# Patient Record
Sex: Female | Born: 1965 | Race: Black or African American | Hispanic: No | Marital: Married
Health system: Midwestern US, Community
[De-identification: ages and names within clinical notes are randomized; demographics above are authoritative.]

---

## 1999-08-02 NOTE — ED Provider Notes (Signed)
[facility]                      EMERGENCY DEPARTMENT TREATMENT REPORT   NAMEMarland Kitchen  KIAHNA, BANGHART   MR#:  [birth year]     BILLING #: [id number]         DOS: 08/02/1999  TIME: 4:03                                                                    A   cc:   Docia Furl, M.D.   Primary Physician:   CHIEF COMPLAINT:   Epigastric and chest pain.   HISTORY OF PRESENT ILLNESS:  The patient states that for the last three to   four days she has been having intermittent left pain.  It is mostly in her   side.  She points under her ribs in the epigastric area.  Sometimes it   shoots up to her shoulders.  Sometimes it shoots down to her hip.  It   occasionally makes her nauseous.  She has also had some numbness in her   arms and just a variety of vague symptoms.  She is not sure exactly what is   going on.  She states that currently she sort of feels nauseous and that   most of her problem is in the epigastric area.   REVIEW OF SYSTEMS:   CONSTITUTIONAL:  No fever, chills, weight loss.   EYES: No visual symptoms.   ENT:  She has had a bit of a runny nose.   ENDOCRINE:  No diabetic symptoms.   HEMATOLOGIC/LYMPHATIC:  No excessive bruising or lymph node swelling.   ALLERGIC/IMMUNOLOGIC:  No urticaria or allergy symptoms.   RESPIRATORY:  She has had no shortness of breath.   She does state sort of   a chest pain type, but she points mostly to her lower ribs on the left.   GASTROINTESTINAL:  She complains of epigastric pain and occasional nausea.   GENITOURINARY:  No dysuria, frequency, or urgency.   MUSCULOSKELETAL:  No joint pain or swelling.   INTEGUMENTARY:  No rashes.   PAST MEDICAL HISTORY:  Negative except for depression and asthma.   PAST SURGICAL HISTORY:   Negative.   SOCIAL HISTORY:   Negative.  Quit smoking two years ago.   PHYSICAL EXAMINATION:   VITAL SIGNS:  Stable.   GENERAL APPEARANCE:  Patient appears well-developed and well-nourished.    Appearance and behavior are age and situation appropriate.   She appears   comfortable.   HEENT:   Eyes:  Conjunctiva clear, lids normal.  Pupils equal, symmetrical,   and normally reactive.      Ears/Nose:  Hearing is grossly intact to voice.   Internal and external examinations of the ears are unremarkable.   LYMPHATICS:  No cervical or submandibular lymphadenopathy palpated.   RESPIRATORY:  Clear and equal BS.  No respiratory distress, tachypnea, or   accessory muscle use.   CARDIOVASCULAR:  Heart regular, without murmurs, gallops, rubs, or thrills.   PMI not displaced.   She has no chest wall tenderness.   ABDOMEN:  She has very minimal tenderness in the epigastric area, but does   state that is where she has  been feeling the pain.  Otherwise, normoactive   bowel sounds, no peritoneal signs.   EXTREMITIES:  No clubbing, cyanosis, or edema.   PSYCHIATRIC:  Judgment appears appropriate.   Recent and remote memory   appear to be intact.  Oriented to time, place and person.  Mood and affect   appropriate.   SKIN:  Warm and dry without rashes.   IMPRESSION/MANAGEMENT PLAN:   The patient with sort of a nonlocalized area   of pain in the left side of her abdomen and lower chest.  Do not believe   this is cardiac in etiology.  The patient does appear comfortable at this   point.  Differential diagnosis includes gastritis, ulcer disease,   gallbladder dysfunction, pleurisy, pneumonia, or the very low end pulmonary   embolism, although the patient is not tachypneic, tachycardiac,  and is   saturating 100 percent on room air.   DIAGNOSTIC TESTING:   An EKG and a chest x-ray were done.  They were both   within normal limits.  The patient was given a GI cocktail with a great   deal of relief.   FINAL DIAGNOSIS:  Epigastric pain.   DISPOSITION:  The patient will be taking Mylanta before each meal and will   be on a bland diet.  The patient is discharged home in stable condition,    with instructions to follow up with their regular doctor.  They are advised   to return immediately for any worsening or symptoms of concern.   Electronically Signed By:   Docia Furl, M.D. 08/20/1999 02:02   ____________________________   Docia Furl, M.D.   jb D:  08/02/1999 T:  08/07/1999  7:02 P   [id number]

## 1999-10-30 NOTE — ED Provider Notes (Signed)
[facility]                      EMERGENCY DEPARTMENT TREATMENT REPORT   NAME:  Jessica Hamilton, Jessica Hamilton   MR #:  [birth year]   BILLING #: [id number]        DOS: 10/30/1999  TIME:12:52 P   cc:   Primary Physician:   CHIEF COMPLAINT:  Cough and wheezing.   HISTORY OF PRESENT ILLNESS:   The patient is a 33 -year-old female with dry   cough over the last three days' time with increased wheezing today. 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  She denies any associated fever.   REVIEW OF SYSTEMS:   RESPIRATORY: Positive as noted.   CONSTITUTIONAL:  No fever, chills, weight loss.   Denies complaints in any other system.   PAST MEDICAL HISTORY:   Remarkable for asthma.   SOCIAL HISTORY:    The patient is a nonsmoker.  She quit smoking 2 years   ago.   PHYSICAL EXAMINATION:   GENERAL:   The patient is alert and cooperative.   VITAL SIGNS: Blood pressure 106/35, pulse rate of 81, respiratory rate of   28.   HEENT:  Eyes:  Conjunctiva clear, lids normal.  Pupils equal, symmetrical,   and normally reactive.    Mouth/Throat:  Surfaces of the pharynx, palate,   and tongue are pink, moist, and without lesions.   NECK:  Supple, nontender, symmetrical, no masses or JVD, trachea midline,   thyroid not enlarged, nodular, or tender.   PULMONARY: Shows bilateral inspiratory and expiratory wheezes. No use of   accessory muscles noted.   CARDIOVASCULAR:  Heart regular, without murmurs, gallops, rubs, or thrills.   PMI not displaced.   GI:  Abdomen soft, nontender, without complaint of pain to palpation.  No   hepatomegaly or splenomegaly.   MUSCULOSKELETAL:   Nails:  No clubbing or deformities.  Nailbeds pink with   prompt capillary refill.   SKIN:  Warm and dry without rashes.   NEUROLOGIC:  Cranial nerves, deep tendon reflexes, strength, and light   touch sensation are unremarkable.    COURSE IN THE EMERGENCY DEPARTMENT: The patient was given a Proventil and   Atrovent neb followed by Proventil nebs q.10 minutes times two.  She was   given saline lock. She was given Solu-Medrol 125 mg IV.  Tussionex 5 mL   orally was given.   On reevaluation, the patient was resting more comfortably. She had forced   expiratory wheezes, but air exchange was improved.  She was discharged home   with a prescription for Phenergan with codeine, Proventil metered-dose   inhaler and prednisone to be taken as directed.  She is to be directed for   worsening of symptoms.   FINAL DIAGNOSIS:   1.  Acute asthma exacerbation.   DISPOSITION:   The patient was discharged home in stable and improved   condition.   Electronically Signed By:   Erlinda Hong, M.D. 11/01/1999 18:32   ____________________________   TODD Wilfrid Lund, M.D.   zga D:  10/30/1999 T:  10/30/1999  2:16 P   [id number]

## 2001-03-24 NOTE — ED Provider Notes (Signed)
[facility]                      EMERGENCY DEPARTMENT TREATMENT REPORT   NAME:  SUSI, GOSLIN   MR #:  [birth year]   BILLING #: [id number]        DOS: 03/24/2001  TIME: 5:14 P   cc:   Primary Physician:   CHIEF COMPLAINT:  Laceration.   HISTORY OF PRESENT ILLNESS:  [age]-year-old female presents with   laceration left middle finger, sustained today when hand struck a bolt.  No   other complaints.   REVIEW OF SYSTEMS:   MUSCULOSKELETAL:  Positive laceration left middle finger.   PAST MEDICAL HISTORY:  States history of prior overdose.   MEDICATIONS:  None.   ALLERGIES:  None.   IMMUNIZATIONS:  Last tetanus greater than 10 years ago.   SOCIAL HISTORY:  Injury occurred at work.   PHYSICAL EXAMINATION:   VITAL SIGNS:  Blood pressure 126/74; pulse 87; respirations 20; temperature   98.5.   GENERAL:  Well-developed, well-nourished female, alert.   EXTREMITIES:  Left middle finger:  1.5 cm partial flap-like laceration   noted dorsal aspect of the left middle finger over middle phalanx with some   active bleeding noted.  Light touch sensation intact.  Normal capillary   refill.   PROCEDURE NOTE:  The wound cleansed with Betadine and saline and   anesthetized with 1%Xylocaine and explored.  No tendon involvement.  The   wound was sutured with [id number] simple interrupted sutures.   Bacitracin and dressing placed.   FINAL DIAGNOSIS:   1.  Laceration, left middle finger, 1.5 centimeters, simple closure.   DISPOSITION:  Wound care instructions given.  Suture removal in 10-12 days.   The patient was evaluated by myself and Dr. Wilfrid Lund, who agrees with   the above assessment and plan.   Electronically Signed By:   Erlinda Hong, M.D. 03/24/2001 18:39   ____________________________   TODD Wilfrid Lund, M.D.   cd D:  03/24/2001 T:  03/24/2001  5:19 P   [id number]   DIANA A HOULE, PA-C

## 2002-01-13 NOTE — ED Provider Notes (Signed)
[facility]                      EMERGENCY DEPARTMENT TREATMENT REPORT   NAME:  DALMA, PANCHAL   MR #:         BILLING #: [id number]          DOS: 01/13/2002   TIME: 9:21 P   [birth year]   cc:   Primary Physician:   Time of evaluation:  2030.   CHIEF COMPLAINT:  Back pain.   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  She states that her work told she could not   leave and had to continue doing deliveries as well.  After she got off work   approximately at 6:30 she came immediately to the emergency department   complaining of right scapular pain and upper back pain.  She denies any   loss of consciousness, any other injuries.   REVIEW OF SYSTEMS:   HEMATOLOGIC/LYMPHATIC:  No excessive bruising or lymph node swelling.   RESPIRATORY:  No cough, shortness of breath, or wheezing.   CARDIOVASCULAR:  No chest pain, chest pressure, or palpitations.   MUSCULOSKELETAL:  No joint pain or swelling.   NEUROLOGICAL:  No headaches, sensory or motor symptoms.   PAST MEDICAL HISTORY:  Asthma.   FAMILY HISTORY:  Negative.   SOCIAL HISTORY:  Negative.   ALLERGIES:  No known drug allergies.   MEDICATIONS:  None.   PHYSICAL EXAMINATION:   VITAL SIGNS:  Blood pressure 113/69; pulse 67; respirations 18; temperature   98.7.  On a pain scale of 0-10, the patient was a 10/10.   GENERAL APPEARANCE:  The patient appears well developed and well nourished.   Appearance and behavior are age and situation appropriate.   The patient   appears significantly uncomfortable secondary to her upper back and right   scapula.   HEENT:  Head is normocephalic and atraumatic.  Eyes:  Conjunctivae clear,    lids normal.  Pupils equal, symmetrical, and normally reactive.   NECK:  Supple, nontender, symmetrical, no masses or JVD, trachea midline,   thyroid not enlarged, nodular, or tender.   RESPIRATORY:  Clear and equal BS.  No respiratory distress, tachypnea, or   accessory muscle use.  Chest percussion normal.   CARDIOVASCULAR:  Heart regular, without murmurs, gallops, rubs, or thrills.   PMI not displaced. Carotid pulses 2+ and equal bilaterally without bruits.   GI:  Abdomen soft, nontender, without complaint of pain to palpation.  No   hepatomegaly or splenomegaly.   MUSCULOSKELETAL:  Stance and gait appear normal.  Nails:  No clubbing or   deformities.  Nail beds pink with prompt capillary refill.  The patient has   nontender chest, chest wall, and abdomen.  Upper and lower extremities have   good full range of motion without any pain.  The patient has negative point   tenderness to her cervical spine but does have moderate point tenderness   and diffuse tenderness to the thoracic area and right scapula.  Negative   lumbar and sacral bony tenderness, deformities, ecchymosis, soft tissue   swelling, or bony step-offs.   SKIN:  Warm and  dry without rashes.   NEUROLOGIC:  Cranial nerves, deep tendon reflexes, strength, and light   touch sensation are unremarkable.   The patient will have Demerol 20 mg and Vistaril 25 mg IM and will have a   thoracic spine and right scapula film x-ray.   CONTINUATION BY NELSON SANTIAGO, PA-C:   DIAGNOSTIC STUDIES:   T spine and right scapular x-ray were negative.  The   patient's pain at the present time is 1/10.   FINAL DIAGNOSIS:  Upper back contusion secondary to fall.   DISPOSITION:  The patient is discharged with verbal and written   instructions and a referral for ongoing care.  The patient is aware that   they may return at any time for new or worsening symptoms.  Condition is   stable.  Disposition is home.   The patient was told to rest, apply ice for    the next 24 to 48 hours 4-5 times a day 15-20 minute intervals.  Then on   the 3rd to 5th day warm compresses at the same intervals.  Take medications   as directed.  Limited duty until 01/20/02.  No heavy lifting.  If not   better in 7-10 days, the patient can see Dr. Gaynelle Adu or Dr. Silvio Clayman.  The   patient was given Vicodin, #14, 1 q.4-6h. p.r.n., Skelaxin 400 mg to take 1   p.o. q.i.d., #30 given with no refill.  The patient was given off work   until January 21 and then limited duty until January 24.   Electronically Signed By:   Imogene Burn, M.D. 01/17/2002 11:15   ____________________________   Imogene Burn, M.D.   cd/rew  D:  01/13/2002  T:  01/13/2002 10:47 P   [id number]   Hilaria Ota, PA-C

## 2002-07-05 NOTE — ED Provider Notes (Signed)
[facility]                      EMERGENCY DEPARTMENT TREATMENT REPORT   NAME:  Jessica Hamilton, Jessica Hamilton   MR #:         BILLING #: [id number]          DOS: 07/05/2002   TIME:12:45 A   [birth year]   cc:    Patricia Pesa, M.D.   Primary Physician:   CHIEF COMPLAINT:  Headache and dizziness.   HISTORY OF PRESENT ILLNESS:   Yesterday this 36 year old female began   having dizziness in that it feels as though the room is moving around her   from time to time. Moving suddenly does aggravate it but there are no   alleviating factors. It can also happen while she is lying still.  She has   had daily posterior headaches which are 8/10 ever since January, when she   fell on the ice. At that time a CAT scan was negative for anything other   than sinusitis. She has had no recent increase in her headaches and no new   trauma.  She is also concerned because she feels her speech is slurred from   time to time. She has never had the symptom before. Denies any chest pain.   No trouble breathing or swallowing. No abdominal pain, nausea or vomiting.   Denies any numbness, tingling, weakness, or loss of motion of any part of   her body.   REVIEW OF SYSTEMS:   CONSTITUTIONAL:  No fever or chills.   EYES: No blurred or double vision.   ENT: No URI symptoms.   ENDOCRINE:  No diabetic symptoms.   HEMATOLOGIC: No known bleeding dyscrasias.   ALLERGIC/IMMUNOLOGIC:  No urticaria or allergy symptoms.   RESPIRATORY:  No cough, shortness of breath, or wheezing.   CARDIOVASCULAR:  No chest pain, chest pressure, or palpitations.   GASTROINTESTINAL:  No vomiting, diarrhea, or abdominal pain.   MUSCULOSKELETAL:  No joint pain or swelling.   INTEGUMENTARY:  No rashes.   NEUROLOGIC: As in HPI.   PAST MEDICAL HISTORY:  Depression and chronic back pain.   ALLERGIES: Allergic to Tylenol (GI upset).   MEDICATIONS:   No current medications.   FAMILY HISTORY: No family history of early cardiac disease. No family    history of cerebrovascular accident.   SOCIAL HISTORY: Quit smoking 5 years ago.  Nondrinker.   PHYSICAL EXAMINATION:   CONSTITUTIONAL:  Alert tearful 36 year old female.   VITAL SIGNS: Supine blood pressure is 107/65, pulse 70, erect blood   pressure is 128/84, pulse 66, respirations 14, temperature 98.7.   HEENT:  Pupils equal, round, reactive to light. Extraocular movements are   intact. Fundi:  Optic discs are normal in appearance, no gross hemorrhages   or exudates seen.   ENT: The tympanic membranes are canals are clear   without hemotympanum.   NECK:  Soft, supple and nontender with full range of motion.   LYMPHATICS: No cervical or submandibular lymphadenopathy palpated.   RESPIRATORY:  Clear and equal breath sounds.  No respiratory distress,   tachypnea, or accessory muscle use.   CARDIOVASCULAR:  Heart regular, without murmurs, gallops, rubs, or thrills.   PMI not displaced.   GI:  Abdomen soft, supple, nontender, nondistended, with no organomegaly.   MUSCULOSKELETAL: Bilateral upper and lower extremities have full range of   motion. Stance and gait appear normal.   NEUROLOGIC:  Cranial nerves  II-XII grossly intact. Deep tendon reflexes and   strength are unremarkable.   PSYCHIATRIC: Oriented to person, place and time, tearful.   CONTINUATION BY DR. Henrene Hawking:   IMPRESSION/MANAGEMENT PLAN:  Headache since fall in January, persistent.   The patient is complaining of dizziness since yesterday which he states is   vertiginous.   COURSE IN THE EMERGENCY DEPARTMENT:  On reevaluation, the patient without   any nystagmus on examination.  The patient complaining of continued   dizziness.  Neuro examination is completely normal.   DIAGNOSTIC TESTING:  An i-STAT revealed normal electrolytes, glucose 93,   hemoglobin 12, hematocrit 35.  CT scan of the head negative per radiology.   DISPOSITION:  Antivert as needed for dizziness.  Follow up with Dr. Girtha Hake,    on call for medicine.  Return if any worsening symptoms or further   problems.   CONDITION ON DISCHARGE:  Stable.   FINAL DIAGNOSIS:   Persistent cephalgia with dizziness/vertigo.   Electronically Signed By:   Haze Justin, M.D. 07/10/2002 21:24   ____________________________   Haze Justin, M.D.   dd/zga  D:  07/06/2002  T:  07/06/2002  5:15 A   [id number]   Dineen Kid, PA-C

## 2003-10-20 NOTE — ED Provider Notes (Signed)
[facility]                      EMERGENCY DEPARTMENT TREATMENT REPORT   NAME:  Jessica Hamilton, Jessica Hamilton                        PT. LOCATION:   MR #:         BILLING #: [id number]          DOS: 10/20/2003   TIME: 7:15 A   01-12-06   cc:    Billey Chang. MARKS, M.D.   Primary Physician:   Time was: 41.   CHIEF COMPLAINT:  "Something going on in my head where I can't talk and   then I can."   PREHOSPITAL CARE/EMS: Was transported via EMS with IV access obtained and   had a blood sugar of 170.  Initial vital signs: Pulse 80, respirations 18,   blood pressure 131/84, O2 saturation 100% on room air.   HISTORY OF PRESENT ILLNESS: This is a 37 year old black female who has had   a cold recently, went to bed about 2100 tonight and sometime after this   woke up because she has having these "spells."  She feels as if she cannot   speak and then she can speak.  They do not seem to last more then a few   seconds and seemed to staring and then will let out a big breath at the end   of each of these episodes. She has been taking over the counter cold   medicines and took some ?Tylenol earlier tonight.  She denies any history   of similar symptoms and there has been no stress at her house, such as   arguing with anybody.  Family members at the bedside confirm this as well.   She has had some numbness in her left arm tonight, but no other tingling or   weakness. She denies any pain at this time.   REVIEW OF SYSTEMS:   CONSTITUTIONAL:  No fever, chills, weight loss.   ENT:   Has had some nasal/sinus congestion recently.   HEMATOLOGIC/LYMPHATIC:  No excessive bruising or lymph node swelling.   RESPIRATORY:  No cough, shortness of breath, or wheezing.   CARDIOVASCULAR:  No chest pain, chest pressure, or palpitations.   GASTROINTESTINAL:  No vomiting, diarrhea, or abdominal pain.   GENITOURINARY:  No dysuria, frequency, or urgency.   PSYCHIATRIC: Staring spells tonight, short lived in nature and does not    speak during these episodes, and then seems fine in between the episodes.   She is somewhat tearful and denies any stress at home.   PAST MEDICAL HISTORY:  Asthma, prior depression and overdose of Tylenol -   last was admitted here 12/98 for this.   SOCIAL HISTORY: Quit smoking years ago and does not drink alcohol.   ALLERGIES:  Tylenol - GI upset since overdose.   MEDICATIONS:   Over the counter cough drops and Robitussin syrup, Tylenol.   PHYSICAL EXAMINATION:   VITAL SIGNS: Blood pressure 126/72, pulse 76, respirations 18, temperature   98.9, O2 saturation 100% on room air.   GENERAL APPEARANCE:  The patient appears well developed and well nourished.   Appearance and behavior are age and situation appropriate.   However, she   is somewhat intermittently tearful also.   HEENT:   Eyes:  Conjunctivae clear, lids normal.  Pupils equal,   symmetrical, and normally reactive.  NECK:  Supple, nontender, symmetrical, no masses or JVD, trachea midline,   thyroid not enlarged, nodular, or tender.   RESPIRATORY:  Clear and equal breath sounds.  No respiratory distress,   tachypnea, or accessory muscle use.   CARDIOVASCULAR:  Heart regular, without murmurs, gallops, rubs, or thrills.   PMI not displaced.   GASTROINTESTINAL:  Abdomen soft, nontender, without complaint of pain to   palpation.  No hepatomegaly or splenomegaly.   NEUROLOGICAL:  The patient is awake, alert, and aware of surroundings. The   patient shows no focal motor deficits.   PSYCHIATRIC: Is tearful, having intermittent staring spells, seems to be   nonverbal and then after a few seconds will start speaking again when   prompted to.   CONTINUATION BY DR. KISA:   INITIAL ASSESSMENT AND MANAGEMENT PLAN:  The patient presents here with   dizziness, blacking out for the few seconds, holding her breath when it   happens and feeling uncomfortable.  She denies any pain.  No tonoclonic   seizure seen by family, no incontinence.  She does have a runny nose with    right ear pain.  She speaks for awhile and then she stops talking and then   she comes to again and starts talking again.  Family says this is   intermittently occurring.  EMS brought her here and blood sugar was 170.   She will be evaluated further for this.  Nursing notes were reviewed.  Old   records reviewed.   LABORATORY TESTS:  EKG showed normal sinus rhythm, no axis deviation, no   acute abnormalities.   COURSE IN THE EMERGENCY DEPARTMENT:  The patient had an I-stat which was   essentially within normal limits.  Her symptoms did not abate and   continued.  She therefore had a CT ordered for the central cause of this   dizziness and vertigo symptoms that she described with everything moving   around her.  The patient was given Antivert, Phenergan and Benadryl to see   if her symptoms were labyrinthitis related and if they would improve.  Dr.   Henrene Hawking assumed care of this patient at 0715 hours and will follow up   results of this testing and arrange proper disposition.   CONTINUATION BY DR. Melvyn Neth SIEGEL:   COURSE IN THE EMERGENCY DEPARTMENT:  Assumed care of patient from Dr. Arvella Merles   pending head CT.  Head CT normal per radiology.  On reevaluation the   patient still complains of episodes of dizziness where she states she feels   like she is in a trance for periods of time lasting up to 15 seconds.   Neurological examination is normal at the time of reevaluation.  She is   just complaining of some associated dizziness.  I discussed with the   patient the need to follow up with a medical doctor for further evaluation   including a possible EEG if the episodes continue.  I also instructed her   that she cannot drive until she is cleared by a physician secondary to   these episodes being of a recurrent nature.  This has been documented in   the discharge instructions as well. She is given a  prescription for   Antivert as needed and instructed to return if worsening symptoms or   further problems.    CONDITION ON DISCHARGE;  Stable.   FINAL DIAGNOSIS:  Episodic dizziness with altered level of consciousness.   Electronically Signed By:  ERIK H. Arvella Merles, M.D. 10/24/2003 17:02   ____________________________   Wetzel Bjornstad. Arvella Merles, M.D.   sp/sp/jdm  D:  10/20/2003  T:  10/20/2003  9:27 A   [id number](pt1)   Marval Regal, PA-C:

## 2003-11-06 NOTE — Procedures (Signed)
[facility]                           ELECTROENCEPHALOGRAM REPORT   NAME:     Jessica Hamilton, Jessica Hamilton                         DATE:     11/06/2003   EEG#:      [id number]                                      MR#:      [id number]   ROOM:     OP                                         DOB:      02/10/1966   REFERRING PHYSICIAN:    C. Marks   CLINICAL HISTORY:  Episodes of altered level of consciousness; periodic   dyskinesias.   MEDICATION:  Meclizine, Vitamins   DESCRIPTION:  Electrodes were applied according to the standard 10/20   electrode system.  A 16-channel EEG with one channel EKG monitoring was   obtained in the laboratory using the [employer]well digital EEG system.  The   background activity is a well developed low to medium amplitude (5-50   microvolt), 11-13 Hz activity, which is occipitally dominant, symmetric in   the two hemispheres and attenuates appropriately with eye opening.   Occasional runs of irregular theta frequency activity is seen in the left   temporal region.  Three minutes of hyperventilation on performed which show   a good degree of background amplification but fails to activate any   paroxysmal activity, however, some small activity persists up to 2 minutes   after cessation of hyperventilation.   Photic stimulation is performed which shows a good degree of photic driving   symmetrically in the occipital regions.  The left temporal slowing is a   little bit more prominent during photic stimulation.   CLINICAL INTERPRETATION:  Abnormal EEG on the basis of mild slow activity   in the left temporal region.  This indicates a mild neuronal disturbance in   this area.  No distinctly paroxysmal features are seen.   CLASSIFICATION:  Dysrhythmia Grade I; left temporal slowing.   ____________________________________    ___________________________   GILBERT M. SNIDER, M.D.                     Date   bes  D: 11/06/2003  T: 11/08/2003  1:24 P   [id number]

## 2003-11-06 NOTE — Procedures (Signed)
[facility]                           ELECTROENCEPHALOGRAM REPORT   NAME:     Jessica Hamilton, Jessica Hamilton                         DATE:     11/06/2003   EEG#:      [id number]                                      MR#:      [id number]   ROOM:     OP                                         DOB:      04/28/1966   REFERRING PHYSICIAN:    Konrad Saha   CLINICAL HISTORY:  Episodes of altered level of consciousness; periodic   dyskinesias.   MEDICATION:  Meclizine, Vitamins   DESCRIPTION:  Electrodes were applied according to the standard 10/20   electrode system.  A 16-channel EEG with one channel EKG monitoring was   obtained in the laboratory using the Cadwell digital EEG system.  The   background activity is a well developed low to medium amplitude (5-50   microvolt), 11-13 Hz activity, which is occipitally dominant, symmetric in   the two hemispheres and attenuates appropriately with eye opening.   Occasional runs of irregular theta frequency activity is seen in the left   temporal region.  Three minutes of hyperventilation on performed which show   a good degree of background amplification but fails to activate any   paroxysmal activity, however, some small activity persists up to 2 minutes   after cessation of hyperventilation.   Photic stimulation is performed which shows a good degree of photic driving   symmetrically in the occipital regions.  The left temporal slowing is a   little bit more prominent during photic stimulation.   CLINICAL INTERPRETATION:  Abnormal EEG on the basis of mild slow activity   in the left temporal region.  This indicates a mild neuronal disturbance in   this area.  No distinctly paroxysmal features are seen.   CLASSIFICATION:  Dysrhythmia Grade I; left temporal slowing.   ____________________________________    ___________________________   Rod Holler, M.D.                     Date   bes  D: 11/06/2003  T: 11/08/2003  1:24 P   [id number]

## 2009-09-27 NOTE — ED Provider Notes (Signed)
[facility]                      EMERGENCY DEPARTMENT TREATMENT REPORT   PRELIMINARY (DRAFT) -- FINAL REPORT  in HPF   NAME:  Jessica Hamilton, Jessica Hamilton               SEX:            F   DATE:  09/27/2009                     DOB:            1966/12/07   MR#    01-12-06                       TIME SEEN        8:50 A   ACCT#  [id number]                      ROOM:           ER  ER71   cc:   CHIEF COMPLAINT   Asthma.   HISTORY OF PRESENT ILLNESS   This is a 43 year old female who is here with her daughter who is up in   labor and delivery.  She started to feel like she was having an asthma   attack.  The patient was in the process of going out to get her inhaler   when apparently she was stopped by a staff member and brought into the ER.   She is receiving a nebulizer treatment currently and she is anxious to get   upstairs.  She states she has a history of asthma for which she uses   albuterol and Advair as needed.  She denies any chest pain.  She has not   had any leg pain or swelling.  She denies any abdominal pain.   REVIEW OF SYSTEMS   CONSTITUTIONAL:  No fever, chills, weight loss.   ENT: No sore throat, runny nose or other URI symptoms.   RESPIRATORY: Positive for shortness of breath and wheezing.   CARDIOVASCULAR:  No chest pain, chest pressure, or palpitations.   GASTROINTESTINAL:  No vomiting, diarrhea, or abdominal pain.   MUSCULOSKELETAL:  Denies any leg pain or swelling.   INTEGUMENTARY:  No rashes.   PAST MEDICAL HISTORY   Asthma.  Last menstrual period was around 09/07/2009.   SOCIAL HISTORY   Negative for tobacco.  She was in the process of going upstairs to see her   daughter who is in labor and delivery.   FAMILY HISTORY   Noncontributory.   ALLERGIES   No known drug allergies.   MEDICATIONS   Advair and albuterol.   PHYSICAL EXAMINATION   GENERAL:  This is a well-developed female.   VITAL SIGNS:  Blood pressure 118/79, pulse 103, respirations 24,    temperature 98.1, and O2 saturation 94% on room air.   HEENT:  Eyes:  Conjunctivae are clear.  Ears/Nose:  TMs equal bilaterally.   Mouth/Throat:  Mucous membranes are pink.  Throat is clear.   NECK:  Supple, nontender, symmetrical, no masses or JVD, trachea midline.   Thyroid not enlarged, nodular, or tender.   RESPIRATORY:  Lungs: Just a bit diminished at this time.  I cannot hear any   frank wheezing.  She is not dyspneic.  She is not tachypneic.   CARDIOVASCULAR:  Heart has regular rate  and rhythm.   GI:  Abdomen is soft and nontender.   EXTREMITIES:  Calves and nontender.   SKIN:  Without rash.   INITIAL ASSESSMENT/MANAGEMENT PLAN   This is a 43 year old female brought in for evaluation of an asthma attack.   At this time, she is on her second nebulizer.  She is feeling much better,   is anxious to go.  She will be given 60 mg of prednisone here.  Dr. Jerolyn Center   was in to see the patient, findings were discussed.  The patient will be   discharged here with an inhaler, prednisone to continue, push fluids, and   certainly return at anytime if worsening or new concerns.  Repeat   saturations were 99% on room air.  She was moving air well and was not in   any respiratory distress.   FINAL DIAGNOSIS   Evaluation of acute asthma exacerbation.   DISPOSITION/PLAN   The patient was discharged home in stable condition, follow up as above.   The patient was examined by myself and Dr. Stephan Minister agrees with my   assessment and plan.   ____________________________   Stephan Minister, M.D.   Dictated By:  Fara Chute, PA-C   My signature above authenticates this document and my orders, the final   diagnosis(es), discharge prescription(s) and instructions in the [facility] record.   tw  D:  09/27/2009  T:  09/28/2009  7:41 A   [id number]

## 2016-08-08 ENCOUNTER — Emergency Department: Admit: 2016-08-08 | Payer: Self-pay | Primary: Family Medicine

## 2016-08-08 ENCOUNTER — Inpatient Hospital Stay
Admit: 2016-08-08 | Discharge: 2016-08-08 | Disposition: A | Discharge status: Home / Self Care | Payer: Self-pay | Attending: Emergency Medicine

## 2016-08-08 DIAGNOSIS — R079 Chest pain, unspecified: Secondary | ICD-10-CM

## 2016-08-08 LAB — EKG 12-LEAD
Atrial Rate: 69 {beats}/min
Diagnosis: NORMAL
P Axis: 57 degrees
P-R Interval: 164 ms
Q-T Interval: 416 ms
QRS Duration: 64 ms
QTc Calculation (Bazett): 445 ms
R Axis: -3 degrees
T Axis: 23 degrees
Ventricular Rate: 69 {beats}/min

## 2016-08-08 LAB — ECHO STRESS
Duke treadmill score: -4
Duke treadmill score: -4
ECG Interp During Ex: NORMAL
ECG Interp. During Exercise: NORMAL
Functional Capacity: NORMAL
Functional capacity: NORMAL
Max Diastolic BP: 89 mmHg
Max Heart Rate: 150 {beats}/min
Max Systolic BP: 170 mmHg
Max. Diastolic BP: 89 mmHg
Max. Heart rate: 150 {beats}/min
Max. Systolic BP: 170 mmHg
Overall BP Response To Exercise: NORMAL
Overall BP response to exercise: NORMAL
Overall HR Response To Exercise: NORMAL
Overall HR response to exercise: NORMAL
Peak EX METS: 1 METS
Peak Ex METs: 1 METS

## 2016-08-08 LAB — CBC WITH AUTOMATED DIFF
BASOPHILS: 0.7 % (ref 0–3)
EOSINOPHILS: 4.9 % (ref 0–5)
HCT: 38.8 % (ref 37.0–50.0)
HGB: 13.2 gm/dl (ref 13.0–17.2)
IMMATURE GRANULOCYTES: 0.5 % (ref 0.0–3.0)
LYMPHOCYTES: 38.8 % (ref 28–48)
MCH: 29.5 pg (ref 25.4–34.6)
MCHC: 34 gm/dl (ref 30.0–36.0)
MCV: 86.8 fL (ref 80.0–98.0)
MONOCYTES: 10.8 % (ref 1–13)
MPV: 10.7 fL — ABNORMAL HIGH (ref 6.0–10.0)
NEUTROPHILS: 44.3 % (ref 34–64)
NRBC: 0 (ref 0–0)
PLATELET: 198 10*3/uL (ref 140–450)
RBC: 4.47 M/uL (ref 3.60–5.20)
RDW-SD: 41.4 (ref 36.4–46.3)
WBC: 4.3 10*3/uL (ref 4.0–11.0)

## 2016-08-08 LAB — POC CHEM8
BUN: 16 mg/dl (ref 7–25)
CALCIUM,IONIZED: 4.8 mg/dL (ref 4.40–5.40)
CO2, TOTAL: 27 mmol/L (ref 21–32)
Chloride: 102 mEq/L (ref 98–107)
Creatinine: 0.8 mg/dl (ref 0.6–1.3)
Glucose: 97 mg/dL (ref 74–106)
HCT: 42 % (ref 38–45)
HGB: 14.3 gm/dl (ref 13.0–17.2)
Potassium: 3.8 mEq/L (ref 3.5–4.9)
Sodium: 139 mEq/L (ref 136–145)

## 2016-08-08 LAB — EKG, 12 LEAD, INITIAL
Atrial Rate: 69 {beats}/min
Calculated P Axis: 57 degrees
Calculated R Axis: -3 degrees
Calculated T Axis: 23 degrees
Diagnosis: NORMAL
P-R Interval: 164 ms
Q-T Interval: 416 ms
QRS Duration: 64 ms
QTC Calculation (Bezet): 445 ms
Ventricular Rate: 69 {beats}/min

## 2016-08-08 LAB — TROPONIN I: Troponin-I: 0.041 ng/ml (ref 0.000–0.045)

## 2016-08-08 LAB — POC TROPONIN
Troponin-I: 0.03 ng/ml (ref 0.00–0.07)
Troponin-I: 0.04 ng/ml (ref 0.00–0.07)

## 2016-08-08 MED ORDER — SODIUM CHLORIDE 0.9 % IJ SYRG
INTRAMUSCULAR | Status: DC | PRN
Start: 2016-08-08 — End: 2016-08-08

## 2016-08-08 MED ORDER — ACETAMINOPHEN 325 MG TABLET
325 mg | Freq: Four times a day (QID) | ORAL | Status: DC | PRN
Start: 2016-08-08 — End: 2016-08-08
  Administered 2016-08-08: 16:00:00 via ORAL

## 2016-08-08 MED ORDER — SODIUM CHLORIDE 0.9 % IJ SYRG
Freq: Three times a day (TID) | INTRAMUSCULAR | Status: DC
Start: 2016-08-08 — End: 2016-08-08

## 2016-08-08 MED ORDER — ASPIRIN 81 MG CHEWABLE TAB
81 mg | ORAL | Status: AC
Start: 2016-08-08 — End: 2016-08-08
  Administered 2016-08-08: 08:00:00 via ORAL

## 2016-08-08 MED FILL — ACETAMINOPHEN 325 MG TABLET: 325 mg | ORAL | Qty: 3

## 2016-08-08 MED FILL — ASPIRIN 81 MG CHEWABLE TAB: 81 mg | ORAL | Qty: 4

## 2016-08-08 NOTE — ED Notes (Signed)
Patient returned from stress echo, placed on tele monitor, and provided with a a snack. Vital signs obtained. Denies chest pain, denies shortness of breath.

## 2016-08-08 NOTE — ED Notes (Addendum)
Change of shift report received Leigh  RN(name) on Jessica Hamilton, Jessica Hamilton .  Report consisted of patient???s Situation, Background, Assessment and Recommendations(SBAR). Information from the following report(s) SBAR and Kardex was reviewed with the off going  Nurse. Opportunity for questions and clarification was provided.

## 2016-08-08 NOTE — ED Notes (Signed)
Bedside and Verbal shift change report given to Clara Control and instrumentation engineer(oncoming nurse) by Marliss CzarLeigh (offgoing nurse). Report included the following information SBAR, Kardex, ED Summary, MAR, Recent Results and Cardiac Rhythm sinus brady.

## 2016-08-08 NOTE — ED Notes (Signed)
TRANSFER - IN REPORT:    Verbal report received from  Peyton(name) on Ilyanna R Ladona Ridgelaylor  being received from ED(unit) for routine progression of care      Report consisted of patient???s Situation, Background, Assessment and   Recommendations(SBAR).     Information from the following report(s) SBAR, Kardex, ED Summary, MAR, Recent Results and Cardiac Rhythm NSR was reviewed with the receiving nurse.    Opportunity for questions and clarification was provided.      Assessment completed upon patient???s arrival to unit and care assumed.

## 2016-08-08 NOTE — ED Provider Notes (Signed)
[facility]  Emer[facility] Treatment Report    Patient: Jessica ConverseDeanna R Hamilton Age: 50 y.o. Sex: female    Date of Birth: 02-04-1966 Admit Date: 08/08/2016 PCP: Sol BlazingPhys Other, MD   MRN: [id number]  CSN: [id number]  Attending: Posey Prontoobert E Stambaugh, M.D.    Room: 101/EO01 Time Dictated: 6:55 AM APP:  Dan HumphreysAshley B Kaziyah Parkison, PA-C     Chief Complaint   Chest pain  History of Present Illness   50 y.o. female complaining of left-sided chest pain described as a pressure pain with a radiating tingling sensation to her left arm at 0200. Pain has since improved but not completely resolved. She felt short of breath and had diaphoresis with onset of chest pain. Denies exacerbating or alleviating factors of symptoms. Denies fever/chills, nausea/vomiting, cough or congestion, abdominal pain. Patient reports she has been under an increased amount of stress over the last 2 weeks dealing with multiple deaths in the family.    Review of Systems     Constitutional: No fever, chills  Eyes: No visual symptoms.  ENT: No sore throat, runny nose  Respiratory: No cough, dyspnea or wheezing.  Cardiovascular: Positive chest pain, palpitations,   Gastrointestinal: No vomiting, diarrhea or abdominal pain.  Genitourinary: No dysuria or  frequency  Musculoskeletal: No lower extremity swelling  Integumentary: No rashes.  Neurological: No headaches  Denies complaints in all other systems.    Past Medical/Surgical History   History reviewed. No pertinent past medical history.  History reviewed. No pertinent surgical history.    Social History     Social History     Social History   ??? Marital status: MARRIED     Spouse name: N/A   ??? Number of children: N/A   ??? Years of education: N/A     Social History Main Topics   ??? Smoking status: Never Smoker   ??? Smokeless tobacco: Never Used   ??? Alcohol use No   ??? Drug use: No   ??? Sexual activity: Not Asked     Other Topics Concern   ??? None     Social History Narrative   ??? None       Family History    History reviewed. No pertinent family history.    Home Medications     None       Allergies   No Known Allergies    Physical Exam   ED Triage Vitals   Enc Vitals Group      BP 08/08/16 0303 176/103      Pulse (Heart Rate) 08/08/16 0303 73      Resp Rate 08/08/16 0303 22      Temp 08/08/16 0303 97.6 ??F (36.4 ??C)      Temp src --       O2 Sat (%) 08/08/16 0303 99 %      Weight 08/08/16 0303 180 lb      Height 08/08/16 0303 5\' 6"      Constitutional: Patient appears well developed and well nourished. Appearance and behavior are age and situation appropriate.  HEENT: Conjunctiva clear. PERRLA. Mucous membranes moist, non-erythematous. Surface of the pharynx, palate, and tongue are pink, moist and without lesions.  Neck: supple, non tender, symmetrical, no masses or JVD.   Respiratory: lungs clear to auscultation, nonlabored respirations. No tachypnea or accessory muscle use.  Cardiovascular: heart regular rate and rhythm without murmur rubs or gallops. Chest wall symmetrical and nontender to palpation.  Gastrointestinal:  Abdomen soft, nondistended, nontender without complaint  of pain to palpation, no CVA tenderness noted bilaterally  Musculoskeletal: Calves soft and non-tender. Distal pulses 2+ and equal bilaterally.  No peripheral edema or significant variscosities.   Integumentary: warm and dry without rashes or lesions  Neurologic: alert and oriented. No facial asymmetry or dysarthria. Moving all extremities well    Impression and Management Plan   49 y.o. female complaining of chest pain with associated dyspnea, diaphoresis and tingling sensation to his left arm. Symptoms are improved. Her vital signs are hemodynamically stable. She is well-appearing, nontoxic and physical exam is benign. We will obtain appropriate labs, EKG and chest x-ray for further evaluation.    Diagnostic Studies   Lab:   Recent Results (from the past 12 hour(s))   POC CHEM8    Collection Time: 08/08/16  3:25 AM   Result Value Ref Range     Sodium 139 136 - 145 mEq/L    Potassium 3.8 3.5 - 4.9 mEq/L    Chloride 102 98 - 107 mEq/L    CO2, TOTAL 27 21 - 32 mmol/L    Glucose 97 74 - 106 mg/dL    BUN 16 7 - 25 mg/dl    Creatinine 0.8 0.6 - 1.3 mg/dl    HCT 42 38 - 45 %    HGB 14.3 13.0 - 17.2 gm/dl    CALCIUM,IONIZED 1.61 4.40 - 5.40 mg/dL   POC TROPONIN-I    Collection Time: 08/08/16  3:28 AM   Result Value Ref Range    Troponin-I 0.03 0.00 - 0.07 ng/ml   CBC WITH AUTOMATED DIFF    Collection Time: 08/08/16  3:29 AM   Result Value Ref Range    WBC 4.3 4.0 - 11.0 1000/mm3    RBC 4.47 3.60 - 5.20 M/uL    HGB 13.2 13.0 - 17.2 gm/dl    HCT 09.6 04.5 - 40.9 %    MCV 86.8 80.0 - 98.0 fL    MCH 29.5 25.4 - 34.6 pg    MCHC 34.0 30.0 - 36.0 gm/dl    PLATELET 811 914 - 782 1000/mm3    MPV 10.7 (H) 6.0 - 10.0 fL    RDW-SD 41.4 36.4 - 46.3      NRBC 0 0 - 0      IMMATURE GRANULOCYTES 0.5 0.0 - 3.0 %    NEUTROPHILS 44.3 34 - 64 %    LYMPHOCYTES 38.8 28 - 48 %    MONOCYTES 10.8 1 - 13 %    EOSINOPHILS 4.9 0 - 5 %    BASOPHILS 0.7 0 - 3 %     EKG- Dr. Truddie Crumble did not see any acute S-T segment or T-wave abnormalities that are consistent with acute ischemia or infarction.  Imaging:    Chest x-ray shows no acute abnormalities as read by ED physician Dr. Truddie Crumble  ED Course/Medical Decision Making   Initial Emergency Department evaluation of this patient appears to be negative for evidence of Acute Coronary Ischemia requiring hospital admission or urgent intervention. However, ischemic coronary disease has not been eliminated as a consideration. Consequently, the patient will be assigned to the Emergency Department Observation Unit for serial cardiac enzymes and, if these are negative, resting and stress echocardiography or other additional diagnostic testing will be performed    Final Diagnosis   Chest pain    Disposition   Patient admitted to ED observation for chest pain protocol    The patient was personally evaluated by myself and Dr. Truddie Crumble who  agrees  with the above assessment and plan      Luna Fuse PA-C  August 08, 2016    My signature above authenticates this document and my orders, the final ??  diagnosis (es), discharge prescription (s), and instructions in the Epic ??  record.  If you have any questions please contact [phone].  ??  Nursing notes have been reviewed by the physician/ advanced practice ??  Clinician.

## 2016-08-08 NOTE — Progress Notes (Signed)
2d Stress echo done

## 2016-08-08 NOTE — ED Notes (Signed)
I have reviewed discharge instructions with the patient.  The patient verbalized understanding.Patient armband removed and given to patient to take home.  Patient was informed of the privacy risks if armband lost or stolen

## 2016-08-08 NOTE — ED Triage Notes (Signed)
C/o mid sternal chest tightness and L arm tingling sensation x 30 min, pt anxious and tearful on arrival, EKG in triage, pt reports she was lying down to go to sleep when the pain started. Reports she has been under stress with two recent deaths in the family.

## 2016-08-08 NOTE — ED Progress Note (Signed)
Patient sleeping. She is due for her third troponin at 930 then stress.    Patient Vitals for the past 24 hrs:   Temp Pulse Resp BP SpO2   08/08/16 0530 97.9 ??F (36.6 ??C) (!) 57 18 147/87 100 %   08/08/16 0502 - 67 15 (!) 142/94 99 %   08/08/16 0410 - (!) 55 15 169/87 99 %   08/08/16 0342 - (!) 56 16 167/86 97 %   08/08/16 0328 - (!) 58 16 173/85 99 %   08/08/16 0321 - 60 14 - 100 %   08/08/16 0303 97.6 ??F (36.4 ??C) 73 22 (!) 176/103 99 %       Carmelina DaneKara M Sonnia Strong, PA-C

## 2016-08-08 NOTE — Discharge Summary (Signed)
[facility]  Emergency ObservationDepartment   Chest Pain Discharge Summary    Patient: Jessica ConverseDeanna Hamilton Hamilton Age: 50 y.o. Sex: female    Date of Birth: 08-03-1966 Admit Date: 08/08/2016 PCP: Sol BlazingPhys Other, MD   MRN: [id number]  CSN: [id number]     Room: 101/EO01       ED Physician  Stambaugh  Discharge Physician   McCormack  Observation Admission   08/08/16 at 0455  Observation Discharge  08/08/16 11:41 AM    History of Present Illness   50 y.o. female was seen in the ED for evaulation of chest pain.  The initial evaluation was unremarkable and subsequently the patient was assigned to ED Observation for further risk stratification to include repeat cardiac enzymes, and if negative, cardiac stress testing.     ED Observation Course   Course in OBS-The patient remained pain-free and did not develop other symptoms. Cardiac enzymes were negative and they underwent a EST. The results of which were negative for ischemia.The test was an adequate study with target heart rate achieved.    Echo Results  (Last 48 hours)               08/08/16 1016  ECHO STRESS Final result    Narrative:  ==================== XCELERA REPORT ========================================                                                                        Study ID: [id number]                                                     [facility]                                                     [address] [place]                                                      [place], [place] [id number]                               Stress Echocardiogram Report       Name: Jessica Hamilton, Jessica RStudy Date:       08/08/2016 10:15 AM   MRN: [id number]                 Patient Location: [id number]   DOB: 008-05-1966             Age: 50 yrs   Height: 66 in               Weight: 180 lb  BSA: 1.9 m2   BP: 142/92 mmHg             HR: 57   Gender: Female              Account #: [id number]   Reason For Study: Chest Pain    Ordering Physician: Luna Fuse   Performed By: Kerrin Champagne., RDCS       Interpretation Summary   The OverAll Impression : negative ESE, low probability for obstructive   epicardial CAD .   The Electrocardiographic Interpretation: negative by ECG criteria.   The Echocardiographic Interpretation: hyperkinetic wall motion.       _____________________________________________________________________________   _       Interpretation Summary       Stress Results              Protocol:  Bruce              Target HR: 145 bpm        Maximum Predicted HR: 171 bpm                           Stress Duration:  5:12 mm:ss *                      Maximum Stress HR:  150 bpm *           Stress Comments   A treadmill exercise test according to Bruce protocol was performed. [facility]   Treadmill Score = 5 (low risk). The hemodynamic response to exercise was   normal. The baseline ECG displays normal sinus rhythm. Normal ST changes with   exercise. Arrhythmias: ventricular premature beats. Test was terminated due   to target heart rate was achieved. Normal HR and BP responses to exercise. No   chest pain.       I      WMSI = 1.00     % Normal = 100                                                                   Rest                                                                       II      WMSI = 1.00     % Normal = 100                                                                   Peak  Segments     Size   X - Cannot               2 -                       4 -            1-2       small   Interpret    1 - Normal  Hypokinetic  3 - Akinetic Dyskinetic     3-5   moder   ate   5 -                                                               6-14      large   Aneurysmal                                                       15-16  diffu   se               Left Ventricle    Normal left ventricular systolic function and wall motion augmentation at   rest. The left ventricular systolic function was hyperdynamic at peak   exertion with normal, global wall motion augmentation.       MEASUREMENTS/CALCULATIONS:           Electronically signed byDR Zadie Cleverly, MD   08/08/2016 11:22 AM                   All Cardiac Markers in the last 24 hours:   Lab Results   Component Value Date/Time    TROPT 0.04 08/08/2016 09:34 AM    TROPT 0.041 08/08/2016 07:30 AM    TROPT 0.03 08/08/2016 03:28 AM         Physical Exam     Visit Vitals   ??? BP (!) 146/91 (BP 1 Location: Left arm, BP Patient Position: At rest)   ??? Pulse 65   ??? Temp 97.9 ??F (36.6 ??C)   ??? Resp 18   ??? Ht 5\' 6"  (1.676 m)   ??? Wt 81.6 kg (180 lb)   ??? LMP 08/07/2016   ??? SpO2 100%   ??? BMI 29.05 kg/m2     Respiratory: Clear and equal breath sounds   Cardiovascular: Heart regular without murmer, thrills, gallop, rub    GI:  Abdomen soft non-tender without complaints of pain to palpation      Clinical Impression/Diagnosis       ICD-10-CM ICD-9-CM   1. Chest pain, unspecified type R07.9 786.50       Disposition and Plan   Patient is discharged home in stable condition. It was advised to follow up with their primary care physician in one week to have a recheck. She is here from [facility] and will follow with her PCP when she returned home.  She is here for a funeral- I think her pain is stress related. It was discussed with the patient that their cardiac evaluation was unremarkable and does not indicate that immediate intervention is necessary. The patient was  counseled that the testing is not 100% accurate and may generate false negatives. Patient advised to return to the ED for any new or worsening symptoms.     Dragon medical dictation software was used for portions of this report. Unintended errors may occur.    Jessica DaneKara M Naraya Stoneberg, PA-C  August 08, 2016

## 2016-08-08 NOTE — Discharge Summary (Signed)
[facility]  Emergency ObservationDepartment   Chest Pain Discharge Summary    Patient: Jessica Hamilton Age: 50 y.o. Sex: female    Date of Birth: 08-03-1966 Admit Date: 08/08/2016 PCP: Sol BlazingPhys Other, MD   MRN: [id number]  CSN: [id number]     Room: 101/EO01       ED Physician  Stambaugh  Discharge Physician   McCormack  Observation Admission   08/08/16 at 0455  Observation Discharge  08/08/16 11:41 AM    History of Present Illness   50 y.o. female was seen in the ED for evaulation of chest pain.  The initial evaluation was unremarkable and subsequently the patient was assigned to ED Observation for further risk stratification to include repeat cardiac enzymes, and if negative, cardiac stress testing.     ED Observation Course   Course in OBS-The patient remained pain-free and did not develop other symptoms. Cardiac enzymes were negative and they underwent a EST. The results of which were negative for ischemia.The test was an adequate study with target heart rate achieved.    Echo Results  (Last 48 hours)               08/08/16 1016  ECHO STRESS Final result    Narrative:  ==================== XCELERA REPORT ========================================                                                                        Study ID: [id number]                                                     [facility]                                                     [address] [place]                                                      [place], [place] [id number]                               Stress Echocardiogram Report       Name: Jessica BenAYLOR, Margareth RStudy Date:       08/08/2016 10:15 AM   MRN: [id number]                 Patient Location: [id number]   DOB: 008-05-1966             Age: 50 yrs   Height: 66 in               Weight: 180 lb  BSA: 1.9 m2   BP: 142/92 mmHg             HR: 57   Gender: Female              Account #: [id number]   Reason For Study: Chest Pain   Ordering  Physician: Jessica Hamilton   Performed By: Jessica Champagne., RDCS       Interpretation Summary   The OverAll Impression : negative ESE, low probability for obstructive   epicardial CAD .   The Electrocardiographic Interpretation: negative by ECG criteria.   The Echocardiographic Interpretation: hyperkinetic wall motion.       _____________________________________________________________________________   _       Interpretation Summary       Stress Results              Protocol:  Bruce              Target HR: 145 bpm        Maximum Predicted HR: 171 bpm                           Stress Duration:  5:12 mm:ss *                      Maximum Stress HR:  150 bpm *           Stress Comments   A treadmill exercise test according to Bruce protocol was performed. [facility]   Treadmill Score = 5 (low risk). The hemodynamic response to exercise was   normal. The baseline ECG displays normal sinus rhythm. Normal ST changes with   exercise. Arrhythmias: ventricular premature beats. Test was terminated due   to target heart rate was achieved. Normal HR and BP responses to exercise. No   chest pain.       I      WMSI = 1.00     % Normal = 100                                                                   Rest                                                                       II      WMSI = 1.00     % Normal = 100                                                                   Peak  Segments     Size   X - Cannot               2 -                       4 -            1-2       small   Interpret    1 - Normal  Hypokinetic  3 - Akinetic Dyskinetic     3-5   moder   ate   5 -                                                               6-14      large   Aneurysmal                                                       15-16  diffu   se               Left Ventricle   Normal left ventricular systolic function  and wall motion augmentation at   rest. The left ventricular systolic function was hyperdynamic at peak   exertion with normal, global wall motion augmentation.       MEASUREMENTS/CALCULATIONS:           Electronically signed byDR Zadie Cleverly, MD   08/08/2016 11:22 AM                   All Cardiac Markers in the last 24 hours:   Lab Results   Component Value Date/Time    TROPT 0.04 08/08/2016 09:34 AM    TROPT 0.041 08/08/2016 07:30 AM    TROPT 0.03 08/08/2016 03:28 AM         Physical Exam     Visit Vitals   ??? BP (!) 146/91 (BP 1 Location: Left arm, BP Patient Position: At rest)   ??? Pulse 65   ??? Temp 97.9 ??F (36.6 ??C)   ??? Resp 18   ??? Ht  (1.676 m)   ??? Wt 81.6 kg (180 lb)   ??? LMP 08/07/2016   ??? SpO2 100%   ??? BMI 29.05 kg/m2     Respiratory: Clear and equal breath sounds   Cardiovascular: Heart regular without murmer, thrills, gallop, rub    GI:  Abdomen soft non-tender without complaints of pain to palpation      Clinical Impression/Diagnosis       ICD-10-CM ICD-9-CM   1. Chest pain, unspecified type R07.9 786.50       Disposition and Plan   Patient is discharged home in stable condition. It was advised to follow up with their primary care physician in one week to have a recheck. She is here from [facility] and will follow with her PCP when she returned home.  She is here for a funeral- I think her pain is stress related. It was discussed with the patient that their cardiac evaluation was unremarkable and does not indicate that immediate intervention is necessary. The patient was  counseled that the testing is not 100% accurate and may generate false negatives. Patient advised to return to the ED for any new or worsening symptoms.     Dragon medical dictation software was used for portions of this report. Unintended errors may occur.    Carmelina DaneKara M Naraya Stoneberg, PA-C  August 08, 2016

## 2016-08-09 LAB — EKG 12-LEAD
Atrial Rate: 56 {beats}/min
P Axis: 52 degrees
P-R Interval: 180 ms
Q-T Interval: 404 ms
QRS Duration: 66 ms
QTc Calculation (Bazett): 389 ms
R Axis: -5 degrees
T Axis: 7 degrees
Ventricular Rate: 56 {beats}/min

## 2016-08-09 LAB — EKG, 12 LEAD, INITIAL
Atrial Rate: 56 {beats}/min
Calculated P Axis: 52 degrees
Calculated R Axis: -5 degrees
Calculated T Axis: 7 degrees
P-R Interval: 180 ms
Q-T Interval: 404 ms
QRS Duration: 66 ms
QTC Calculation (Bezet): 389 ms
Ventricular Rate: 56 {beats}/min

## 2018-12-29 IMAGING — CT CT ABDOMEN PELVIS W CONTRAST
2 of 4 series · 17 of 46 positions shown, 19 images · non-contrast
Comparison: none

Exam:CT abdomen and pelvis after oral contrast administration
REASON FOR EXAM: Abdominal pain
TECHNIQUE: Contiguous 2.5 mm thick axial images were obtained through the abdomen and pelvis after IV contrast administration. Informed consent was obtained prior to administration of IV contrast.

[Series 2: abd/pel · axial · 0.98mm/px · z∈[-484,-79]mm · 14 of 182 slices shown, 16 images]
[im 10/182  soft-tissue]
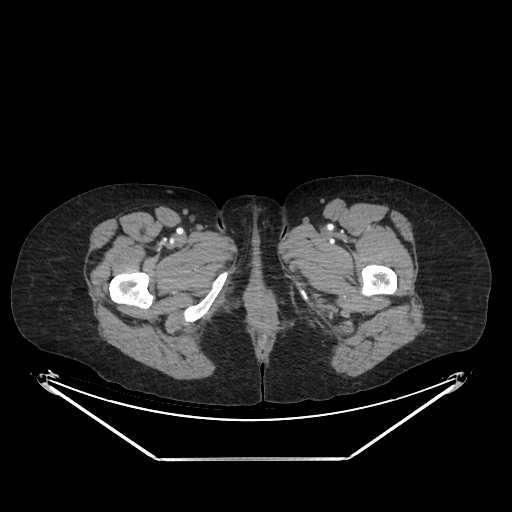
[im 10/182  bone]
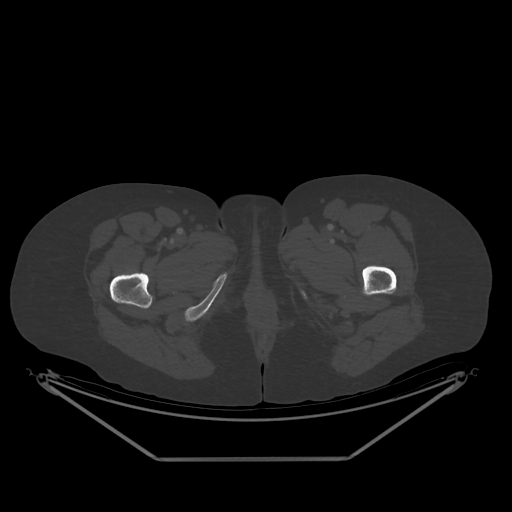
[im 28/182  soft-tissue]
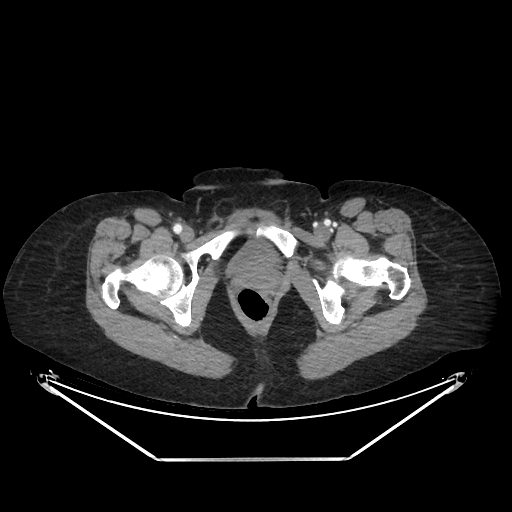
[im 37/182  soft-tissue]
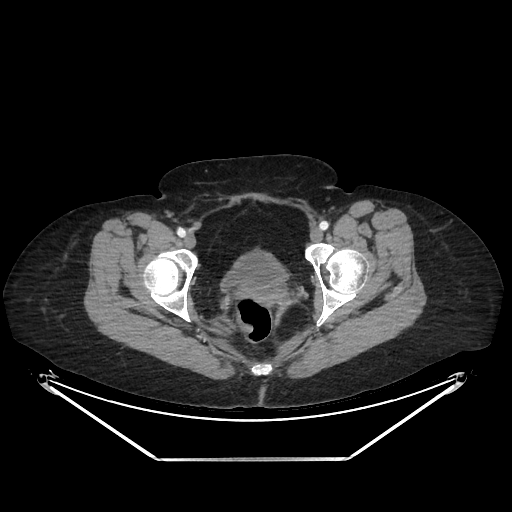
[im 46/182  soft-tissue]
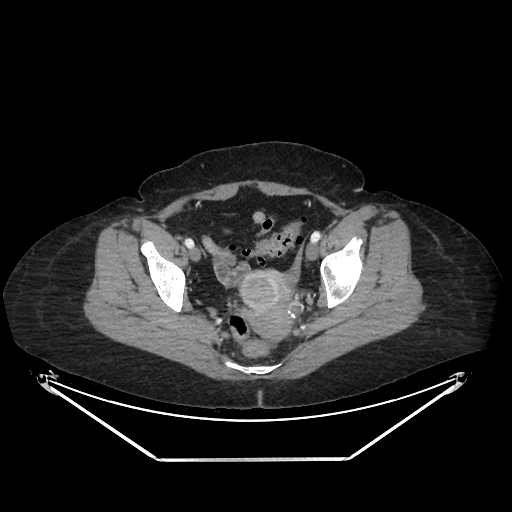
[im 64/182  soft-tissue]
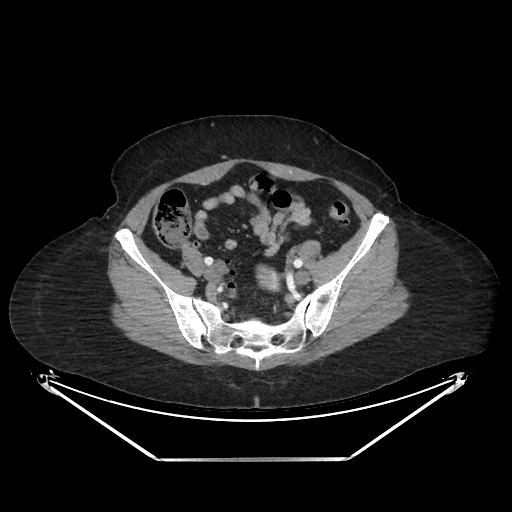
[im 73/182  soft-tissue]
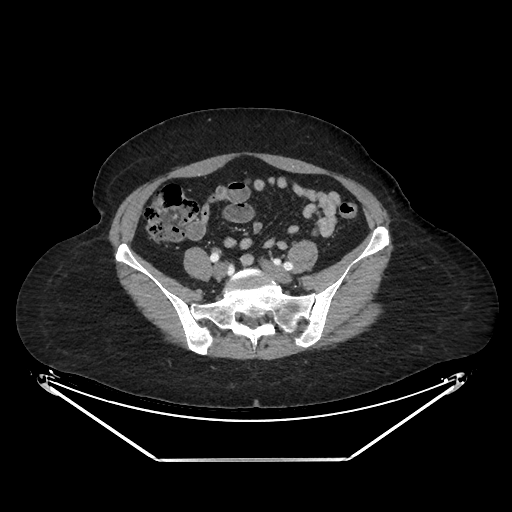
[im 82/182  soft-tissue]
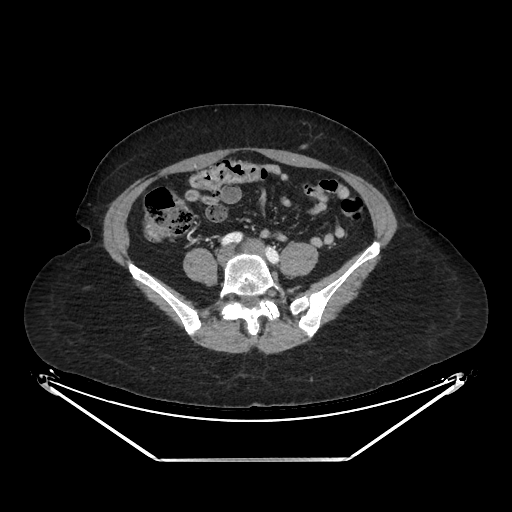
[im 100/182  soft-tissue]
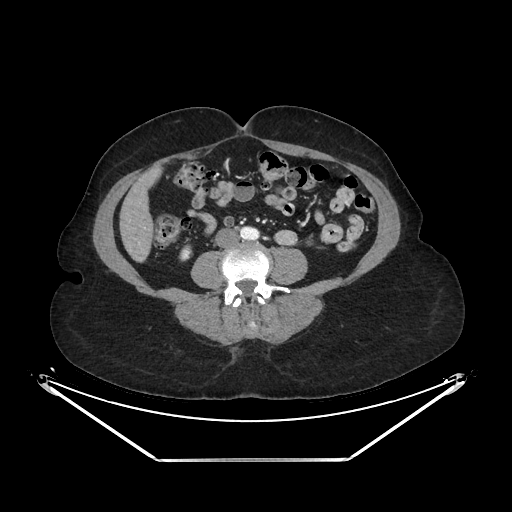
[im 109/182  soft-tissue]
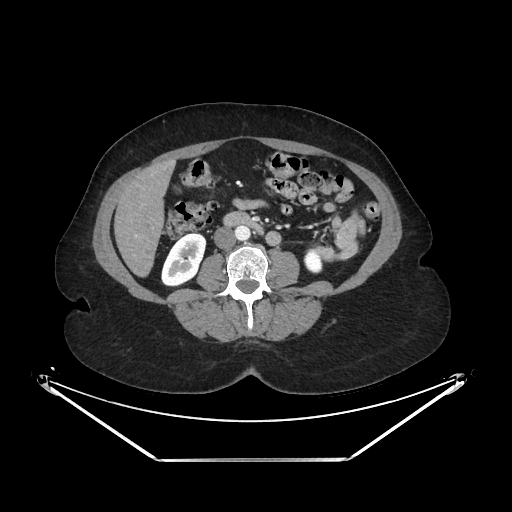
[im 109/182  bone]
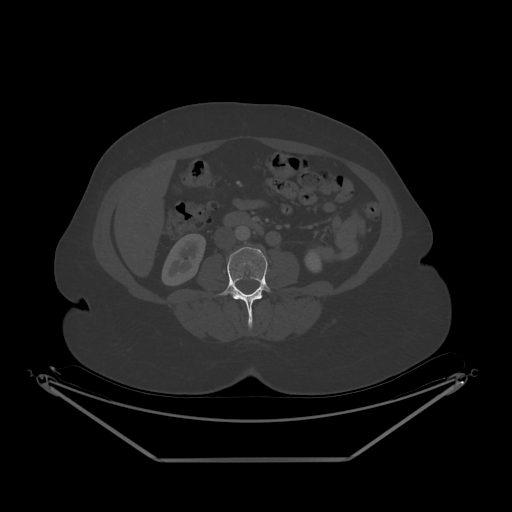
[im 118/182  soft-tissue]
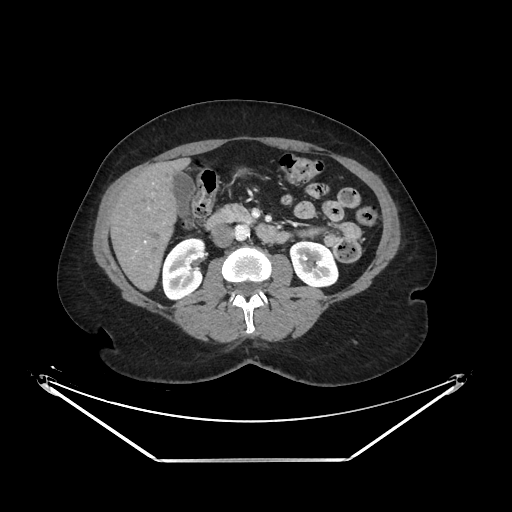
[im 136/182  soft-tissue]
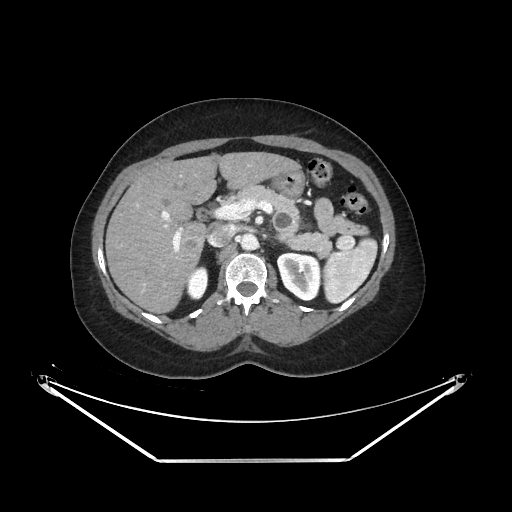
[im 145/182  soft-tissue]
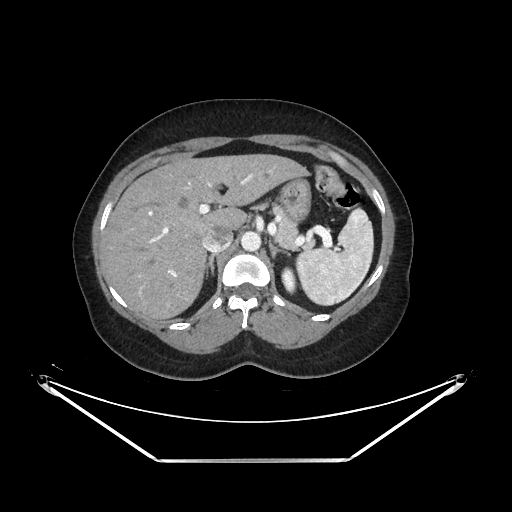
[im 154/182  soft-tissue]
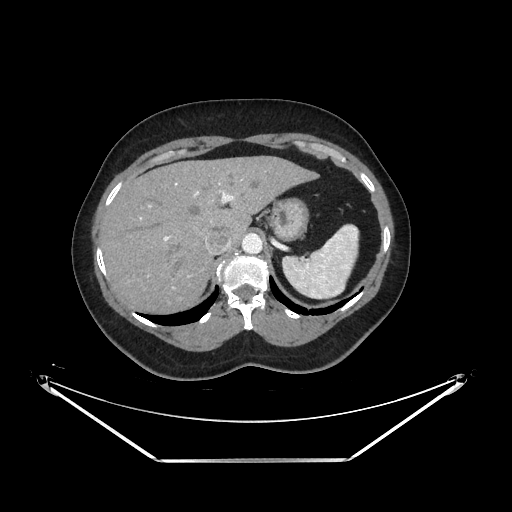
[im 172/182  soft-tissue]
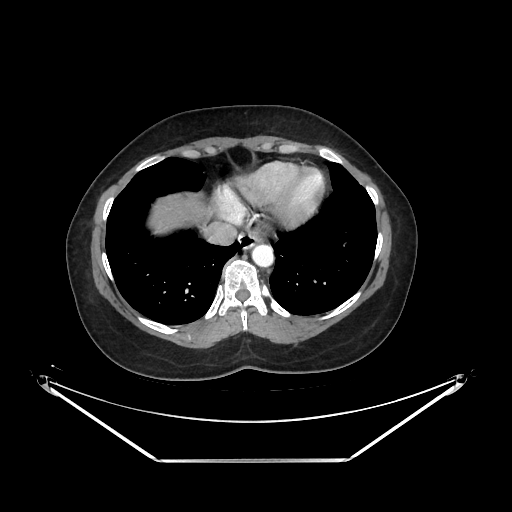

[Series 602: sag standard 2x2 · sagittal · 0.98mm/px · 3 of 246 slices shown]
[im 82/246  soft-tissue]
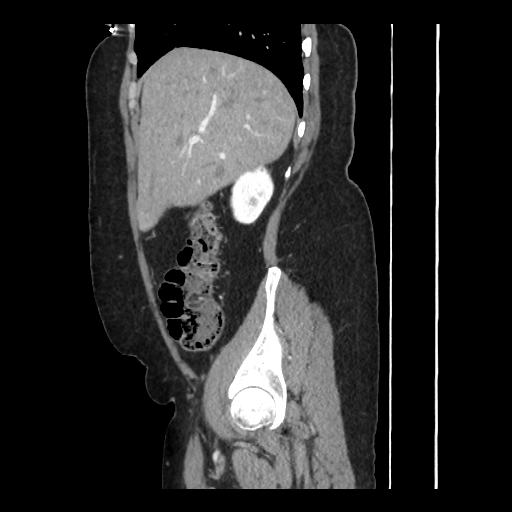
[im 109/246  soft-tissue]
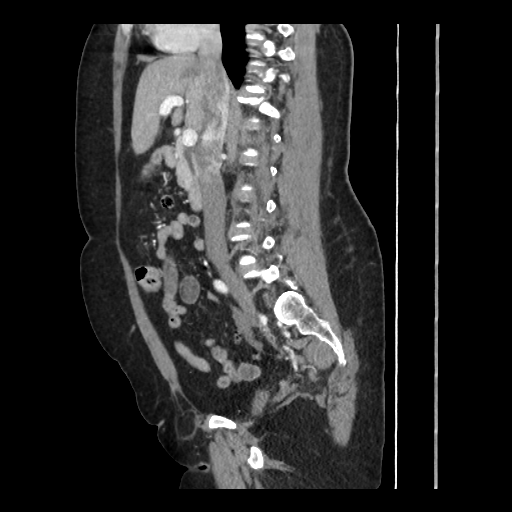
[im 137/246  soft-tissue]
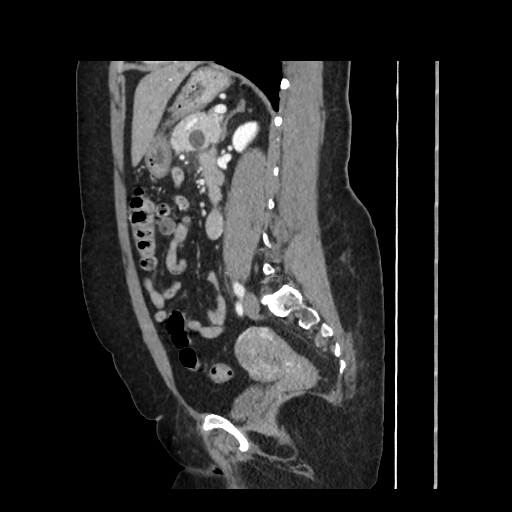

[17 of 46 positions shown; findings below may reference images not displayed]

FINDINGS: The visualized lung bases are clear. The liver, spleen, adrenal glands, and kidneys are within normal limits. No renal or ureteral stone is identified. There is no hydroureter or hydronephrosis.
There is a 1.6 cm cystic mass within the pancreatic tail. The remaining pancreas is within normal limits. The pancreatic duct is of normal caliber.
The abdominal aorta is of normal caliber without evidence of dissection. A normal-appearing aortic bifurcation is present. The appendix is within normal limits. A few uterine fibroids are noted.
There is no abdominal or pelvic lymphadenopathy or ascites. There is no evidence of free air or bowel obstruction. The urinary bladder is within normal limits. No acute osseous abnormality is seen.
IMPRESSION: 1. No evidence of acute intra-abdominal or intrapelvic pathology.
2. 1.6 cm cystic mass in the pancreatic tail of uncertain etiology. This could represent a pseudocyst in the setting of prior pancreatitis. Alternatively, this could represent an intraductal papillary mucinous neoplasm. An abdominal MRI would be beneficial to further evaluate. The exam was placed in the cases needing follow-up folder.
Location:1
Is the patient pregnant?
No

## 2019-01-06 IMAGING — CR XR TIBIA FIBULA 2 VIEWS LEFT
1 series · 5 of 5 positions shown · non-contrast
Comparison: none

Pain for 1 month
EXAM: RIGHT KNEE:
EXAM: RIGHT TIBIA AND FIBULA
CLINICAL INDICATION: Rt knee pain
REFERENCE: None

[Series 6380: AP · left · 5 of 5 slices shown]
[im 1/5]
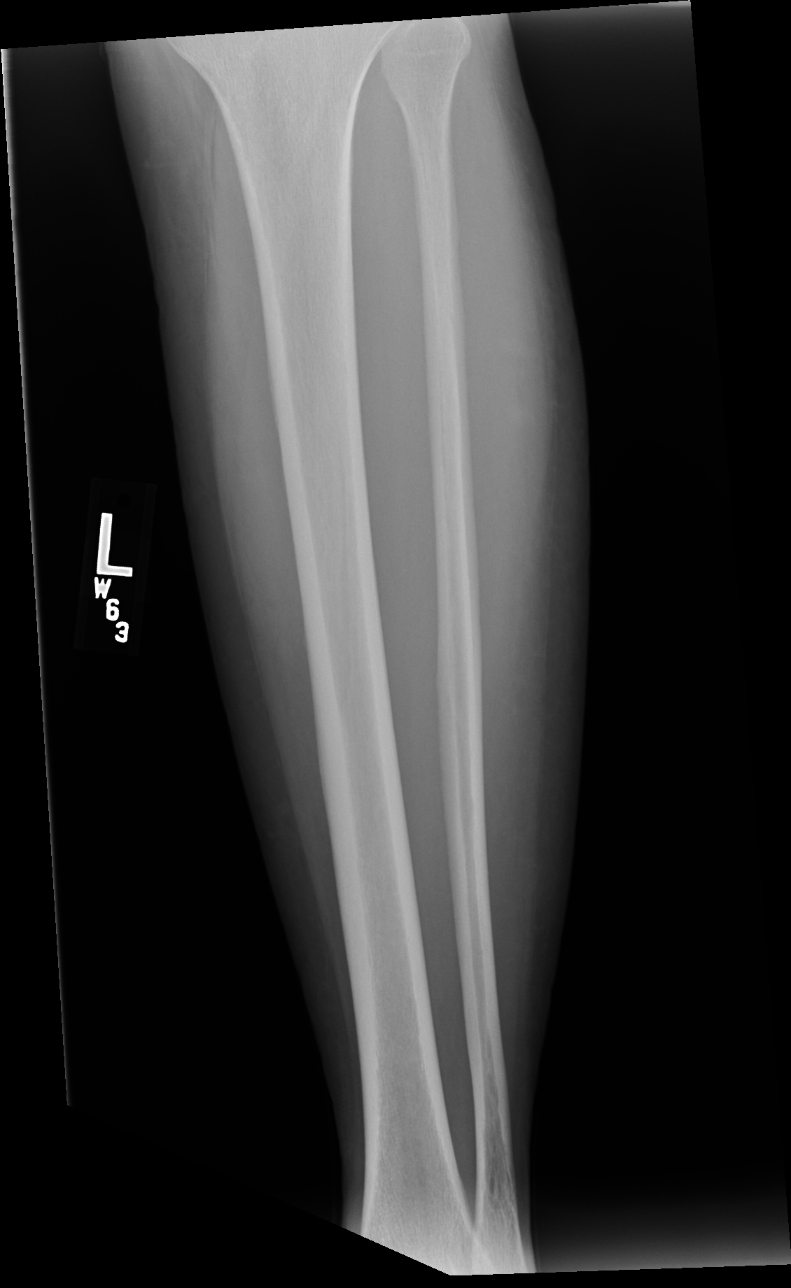
[im 2/5]
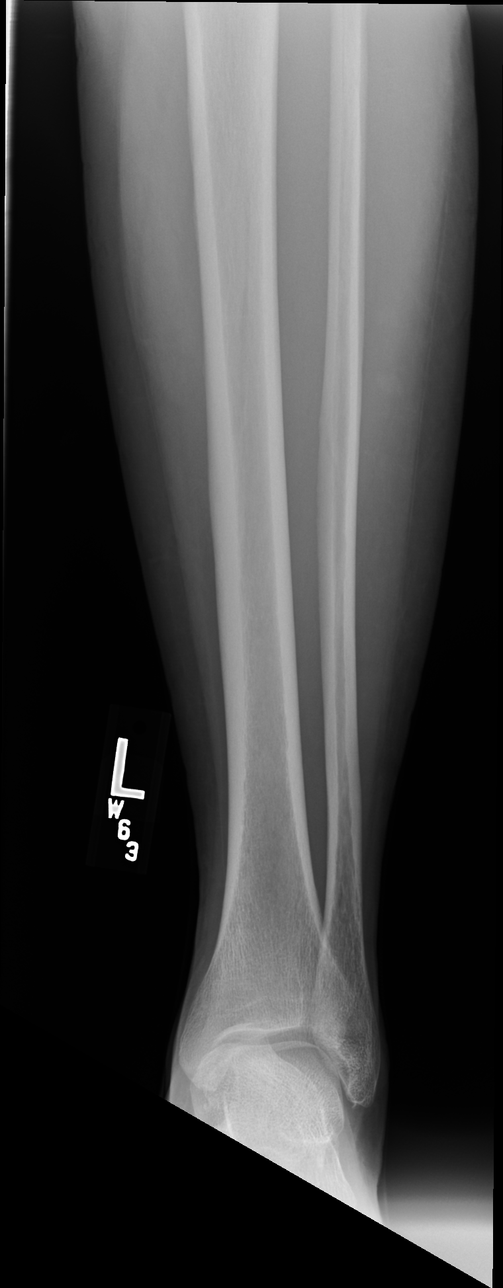
[im 3/5]
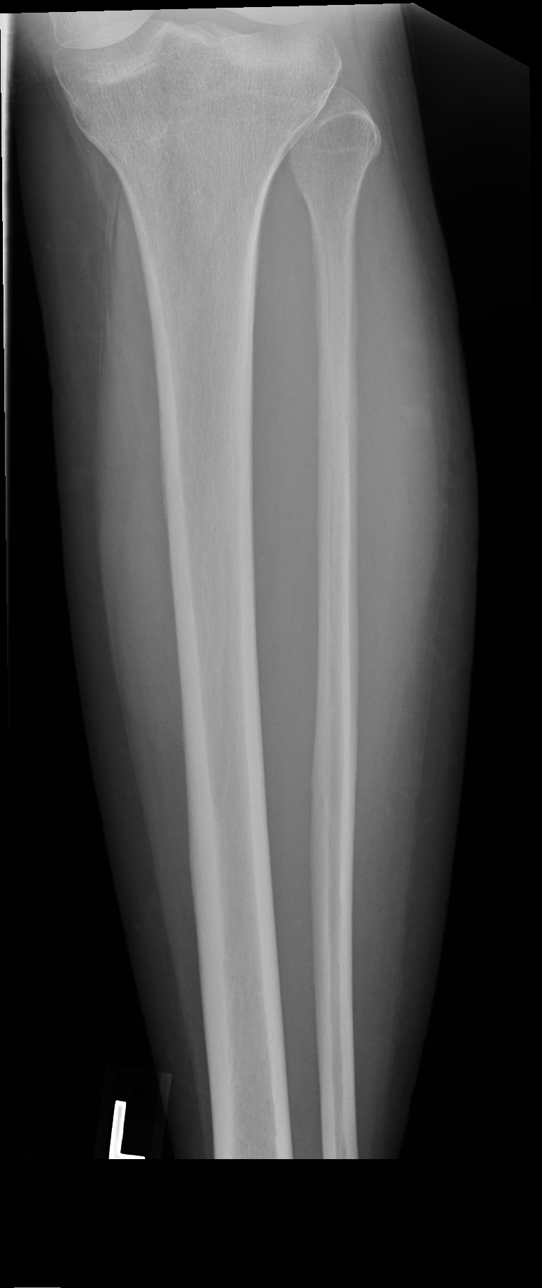
[im 4/5]
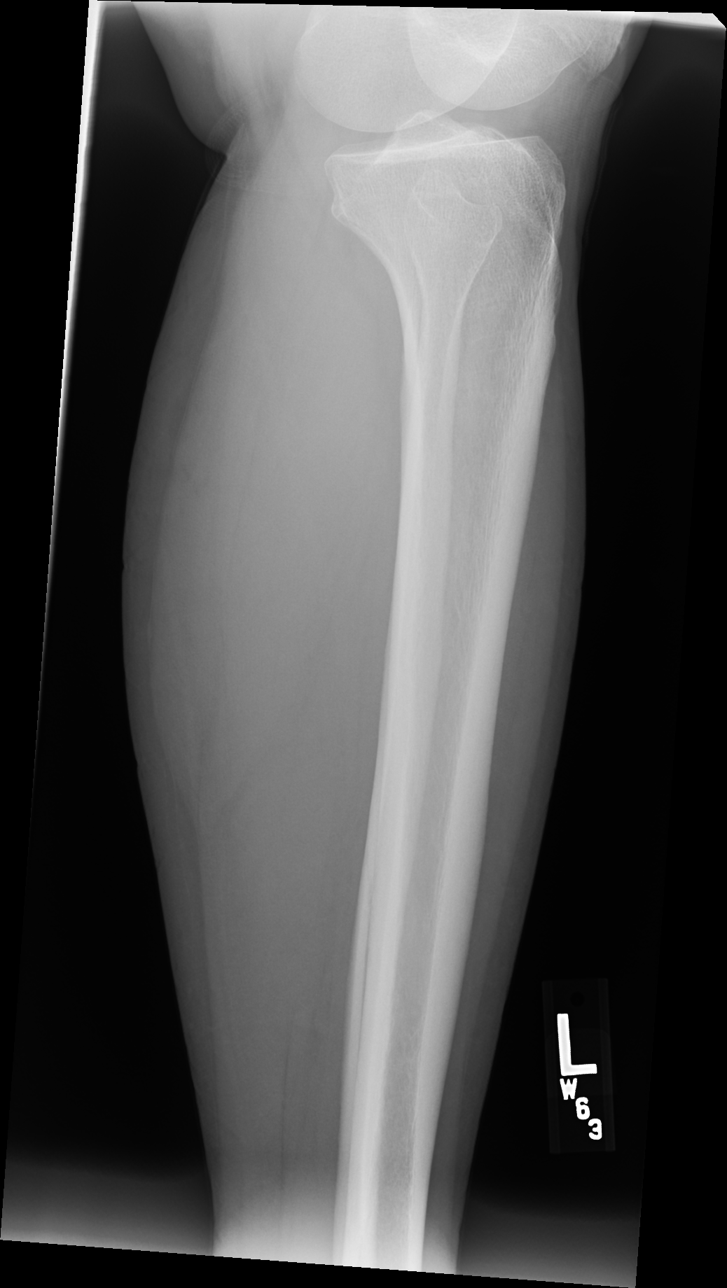
[im 5/5]
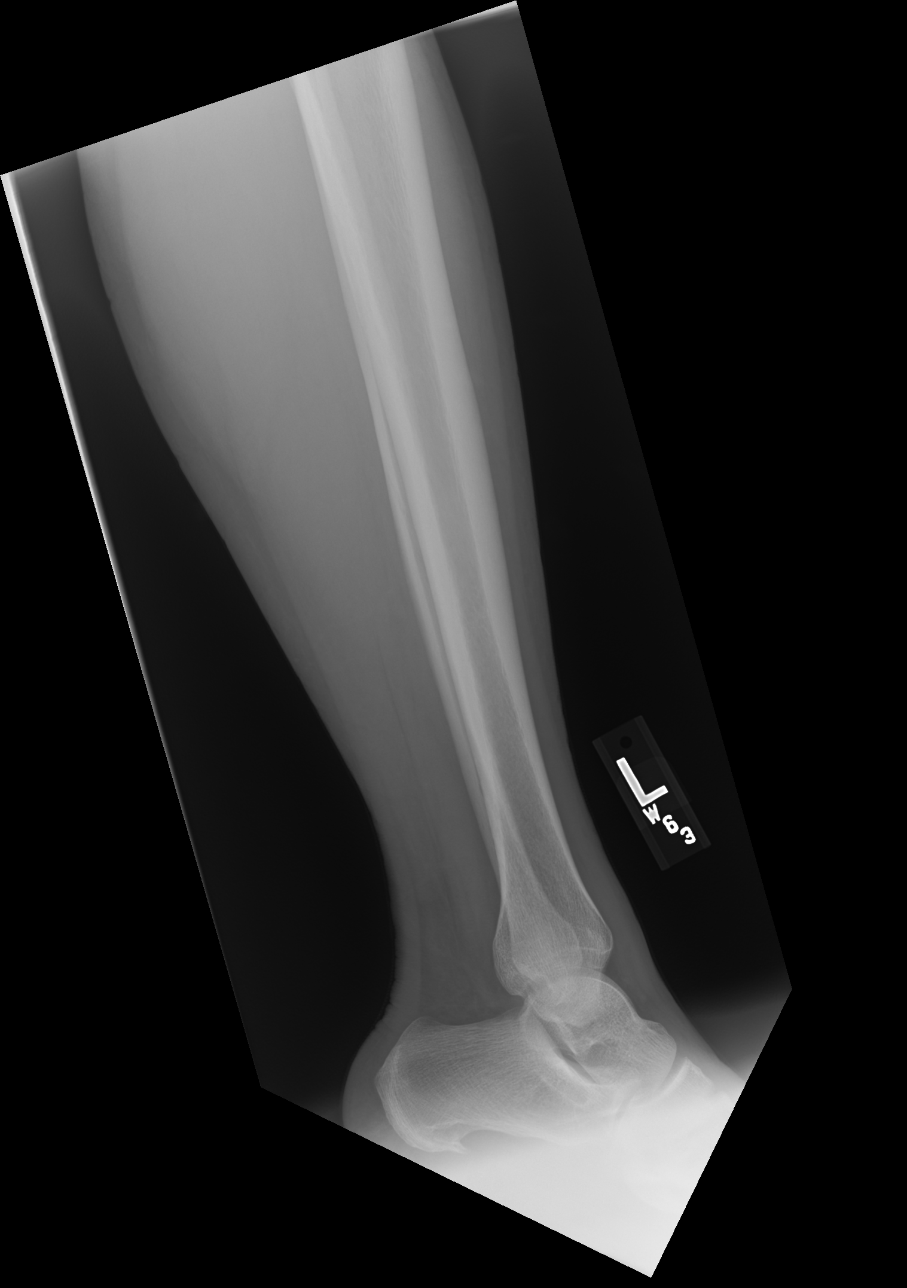

[5 of 5 positions shown; findings below may reference images not displayed]

FINDINGS: AP, tunnel and lateral views of the right knee are presented, with AP and lateral views of the right tibia and fibula.
Bone mineralization is normal. There is no visible acute fracture or dislocation.
The medial tibiofemoral, lateral tibiofemoral and patellofemoral compartment spaces appear normal.
There is a tiny spur along the anterosuperior aspect of the patella at the quadriceps tendon insertion site.
There is a tiny calcaneal spur at the insertion of the plantar aponeurosis.
There are no other degenerative changes.
There is no visible knee effusion.
Otherwise, the surrounding soft tissues appear normal.
IMPRESSION: 1. No acute fracture or dislocation.
2. Mild spurring of the patella and calcaneus.
LOCATION CODE : 4
Is the patient pregnant?
No

## 2019-01-06 IMAGING — CR XR KNEE 2 VIEWS RIGHT
1 series · 3 of 3 positions shown · non-contrast
Comparison: none

Pain for 1 month
EXAM: RIGHT KNEE:
EXAM: RIGHT TIBIA AND FIBULA
CLINICAL INDICATION: Rt knee pain
REFERENCE: None

[Series 6374: AP · right · 3 of 3 slices shown]
[im 1/3]
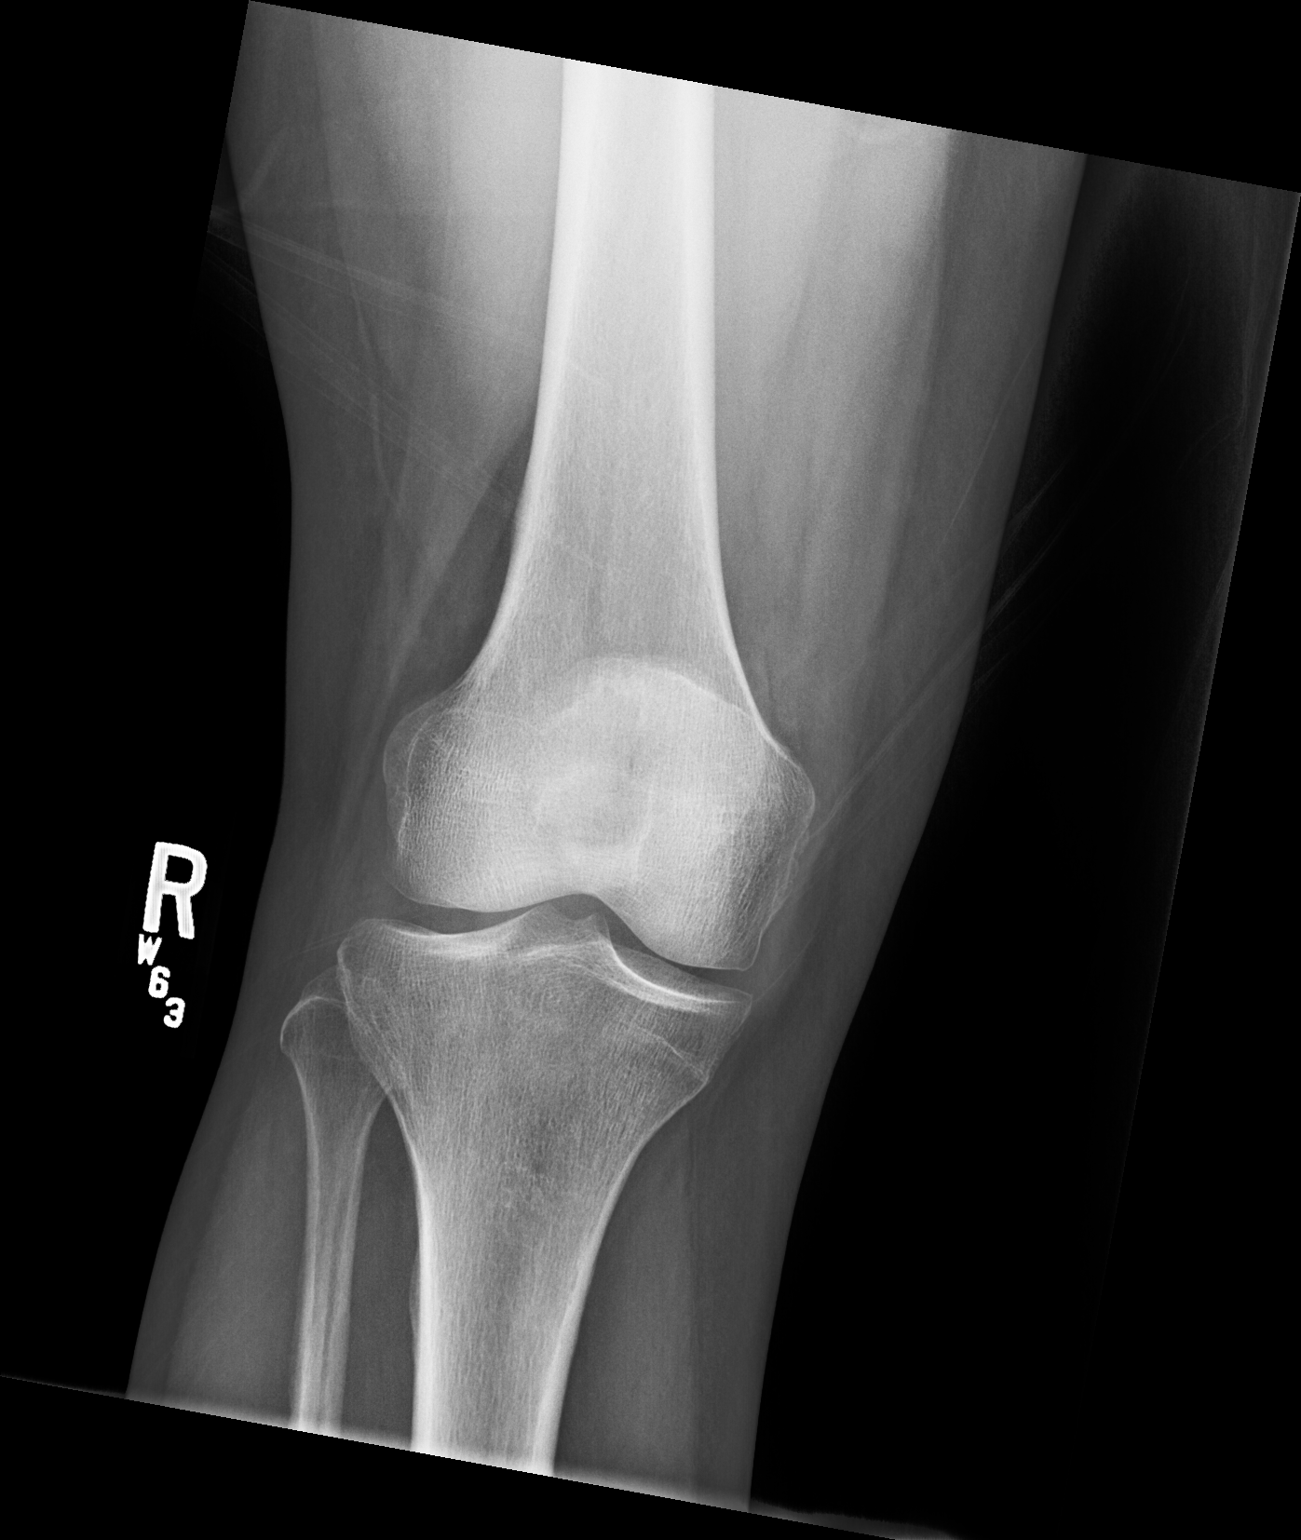
[im 2/3]
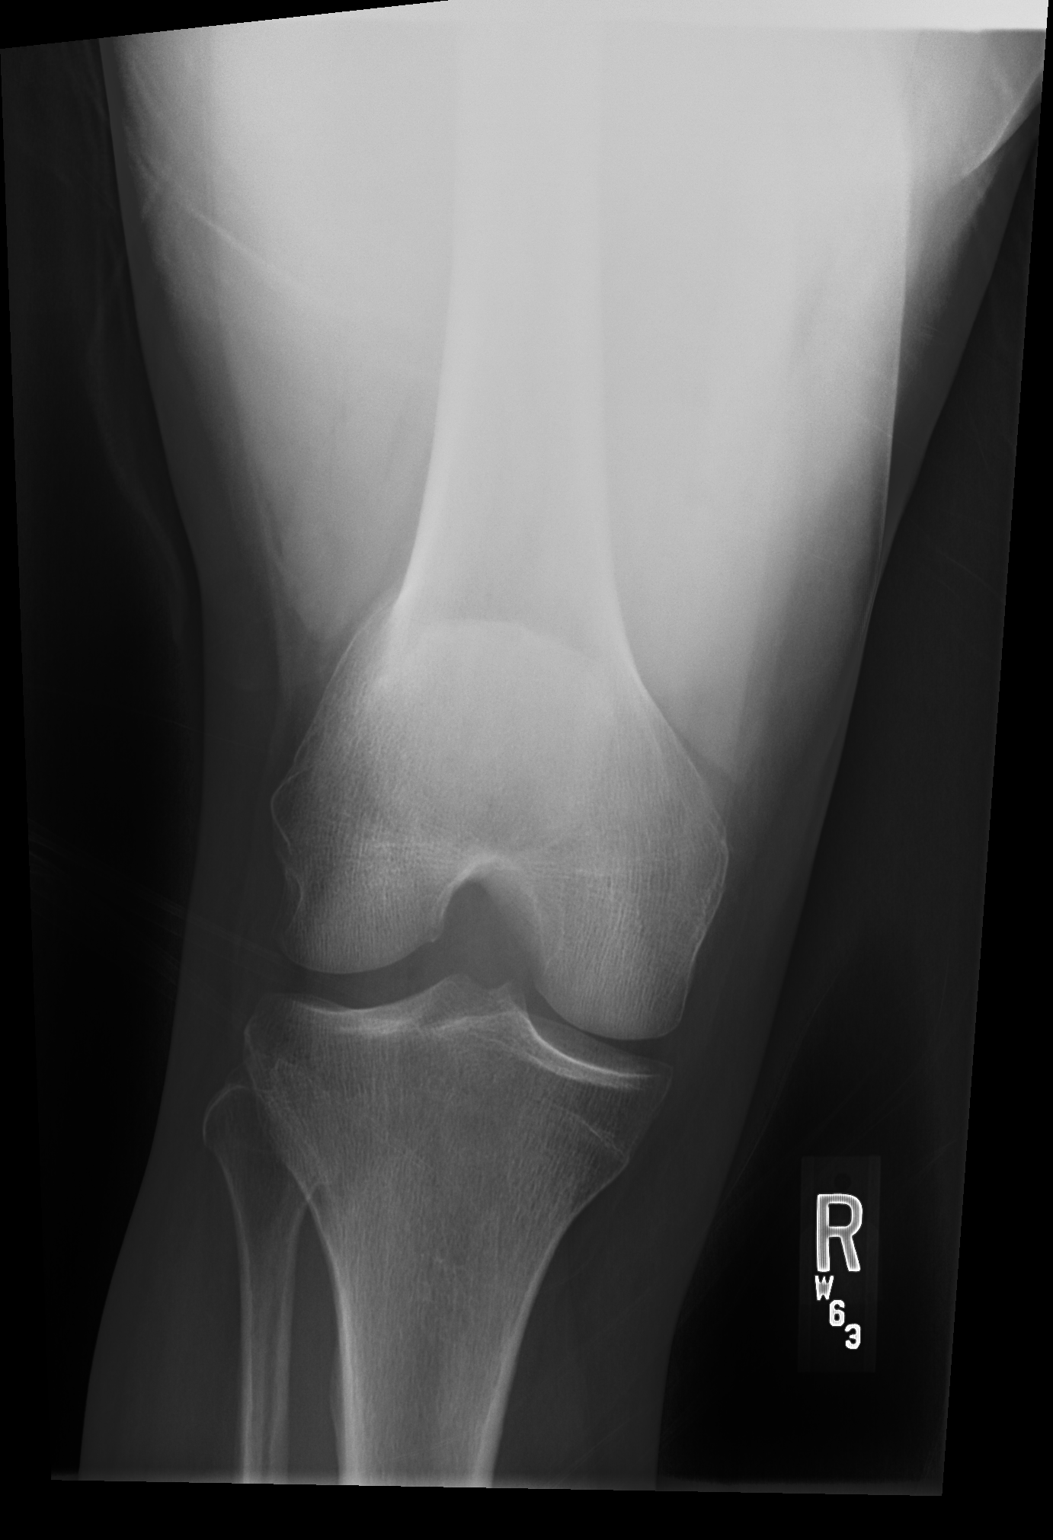
[im 3/3]
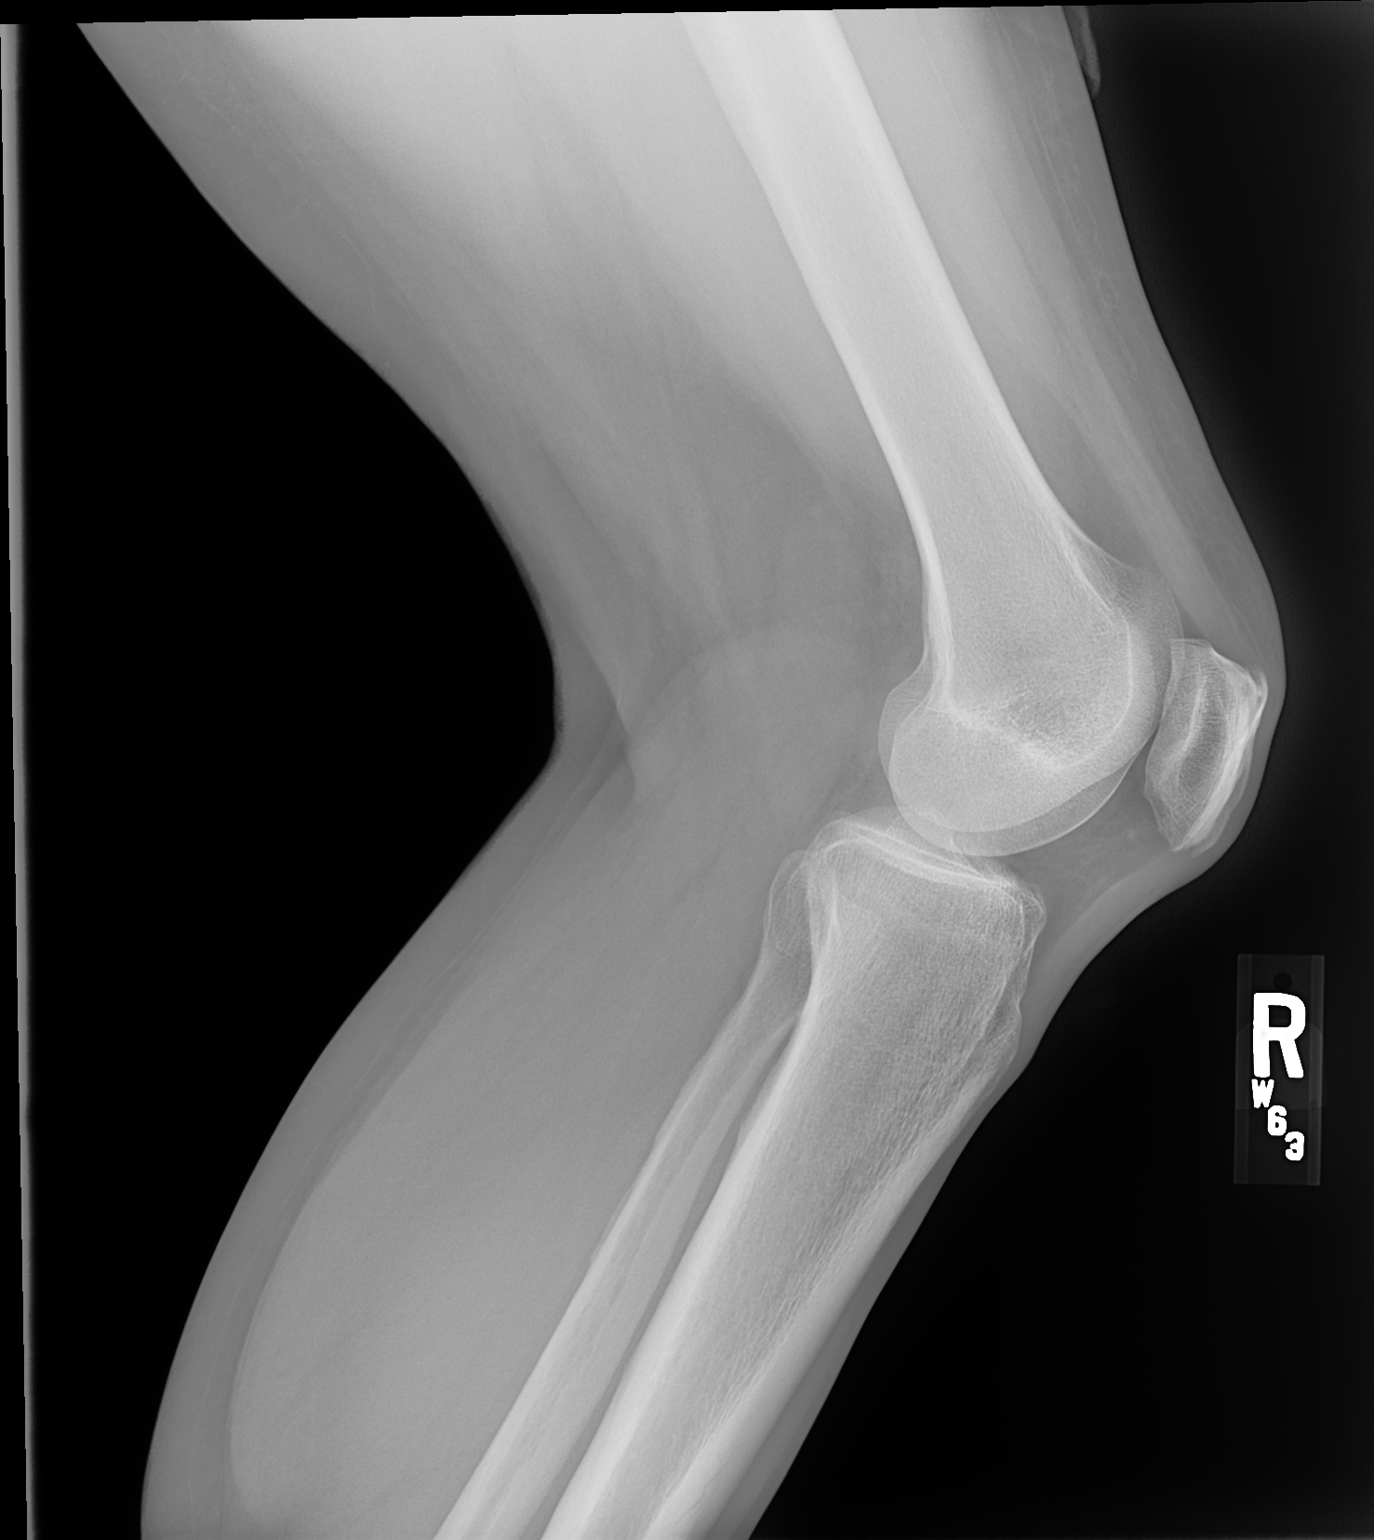

[3 of 3 positions shown; findings below may reference images not displayed]

FINDINGS: AP, tunnel and lateral views of the right knee are presented, with AP and lateral views of the right tibia and fibula.
Bone mineralization is normal. There is no visible acute fracture or dislocation.
The medial tibiofemoral, lateral tibiofemoral and patellofemoral compartment spaces appear normal.
There is a tiny spur along the anterosuperior aspect of the patella at the quadriceps tendon insertion site.
There is a tiny calcaneal spur at the insertion of the plantar aponeurosis.
There are no other degenerative changes.
There is no visible knee effusion.
Otherwise, the surrounding soft tissues appear normal.
IMPRESSION: 1. No acute fracture or dislocation.
2. Mild spurring of the patella and calcaneus.
LOCATION CODE : 4
Is the patient pregnant?
No

## 2020-04-25 IMAGING — US US PELVIS TRANSVAGINAL
1 series · 14 of 25 positions shown · non-contrast
Comparison: none

[Series 1: us pelvis transvaginal · 14 of 39 slices shown]
[im 1/39]
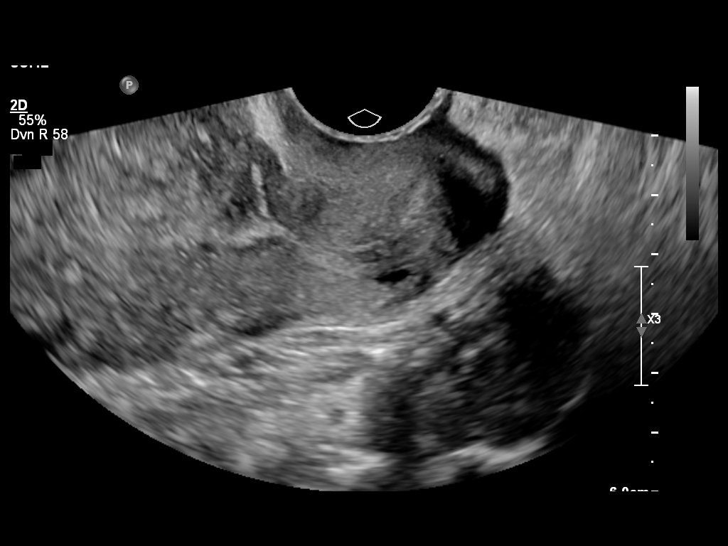
[im 4/39]
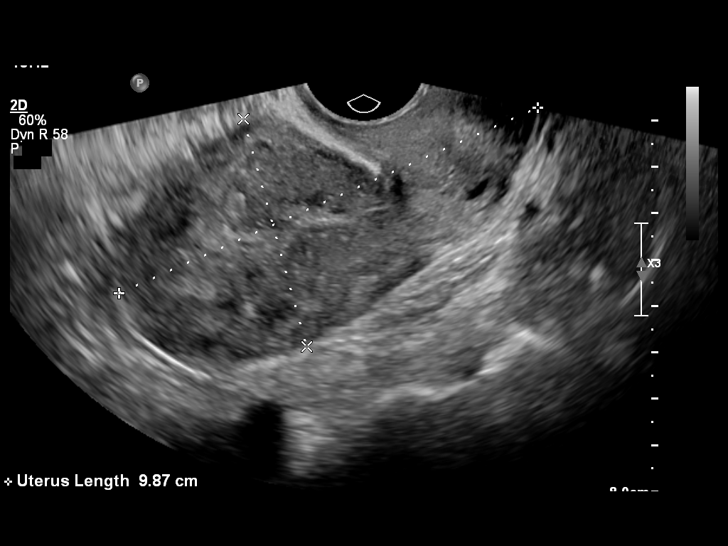
[im 7/39]
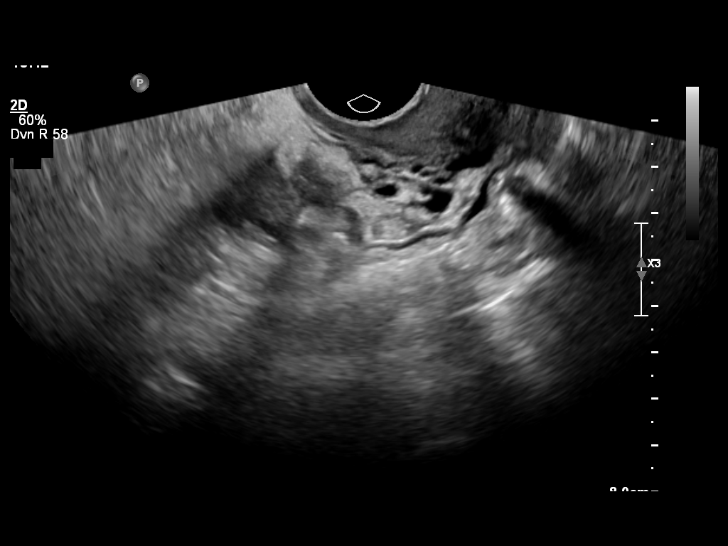
[im 10/39]
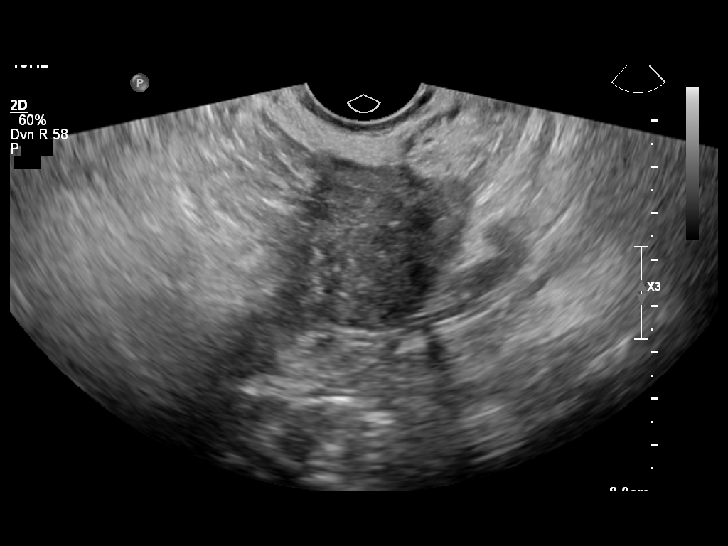
[im 13/39]
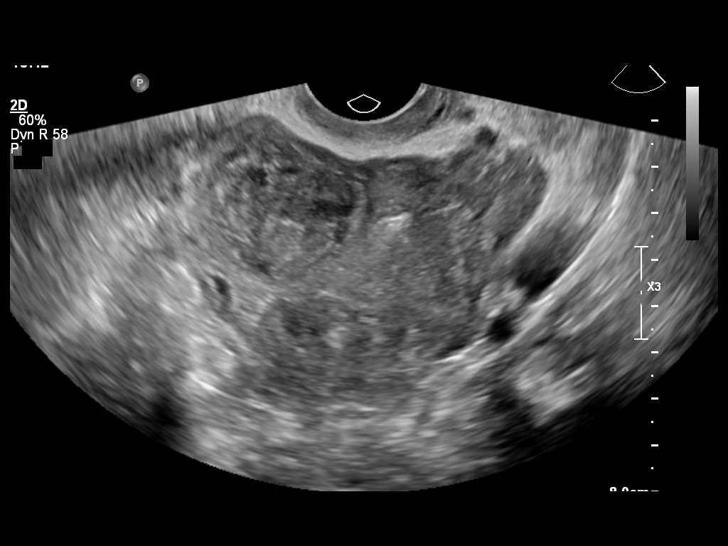
[im 15/39]
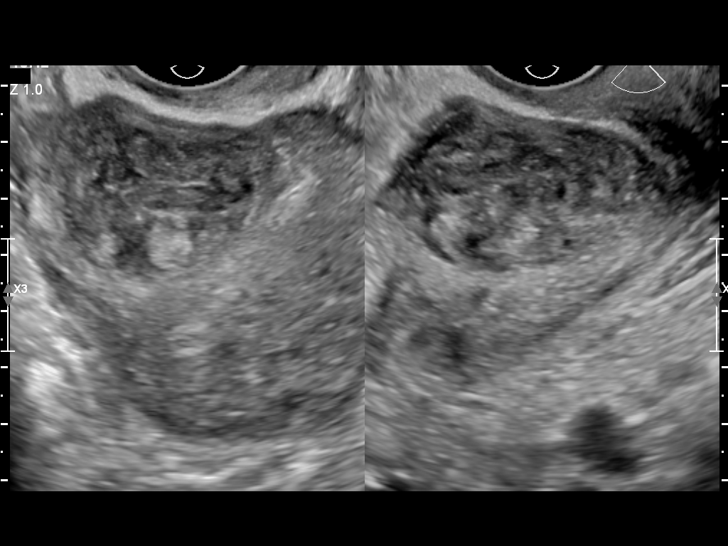
[im 18/39]
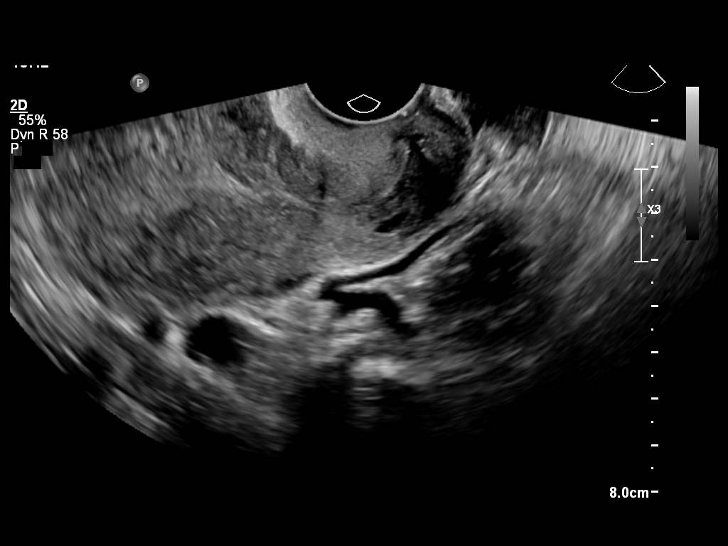
[im 21/39]
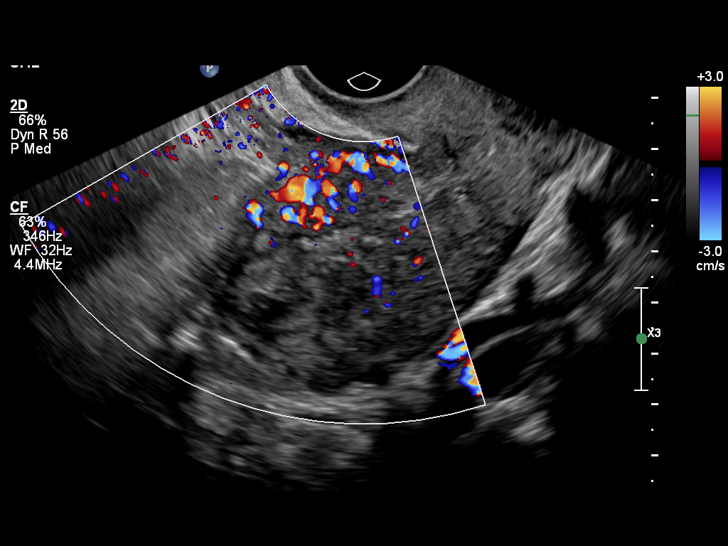
[im 24/39]
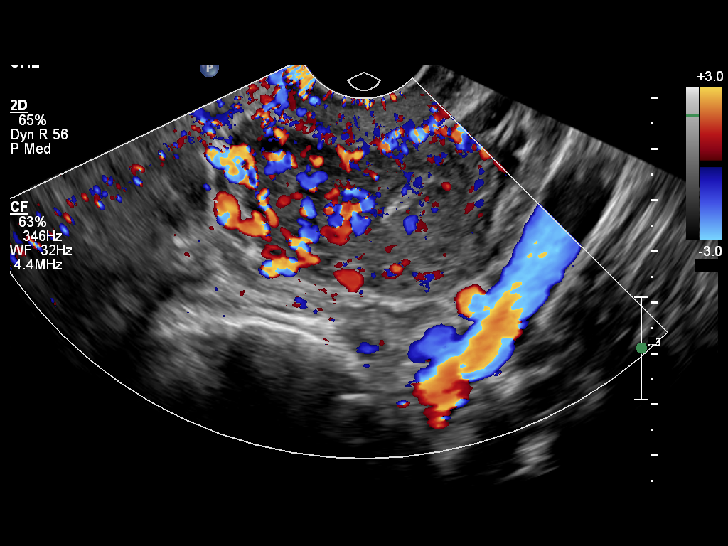
[im 26/39]
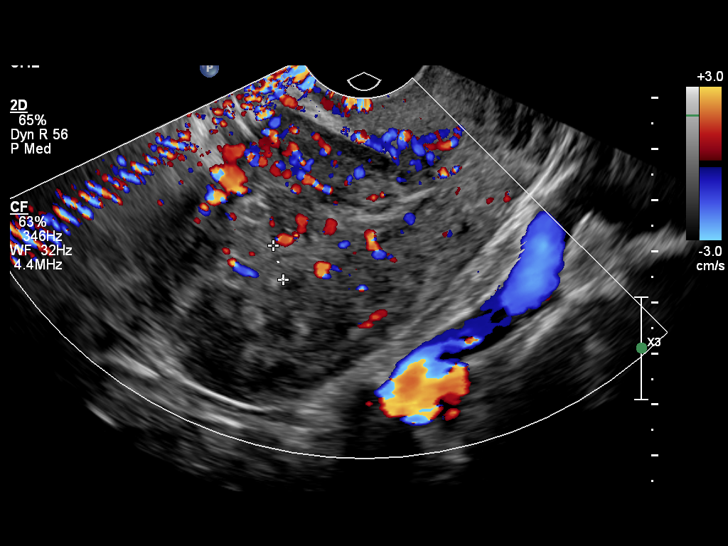
[im 29/39]
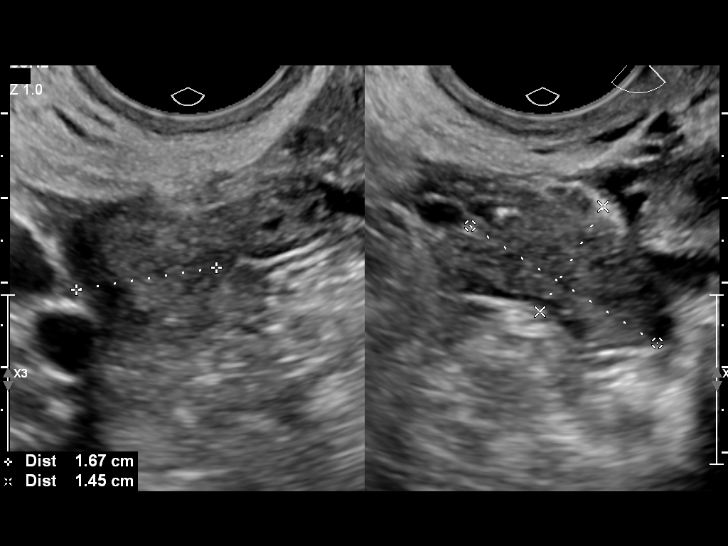
[im 32/39]
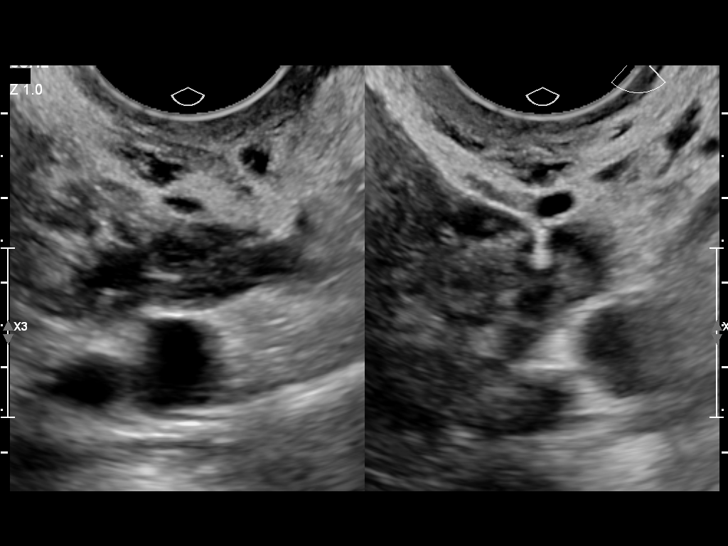
[im 35/39]
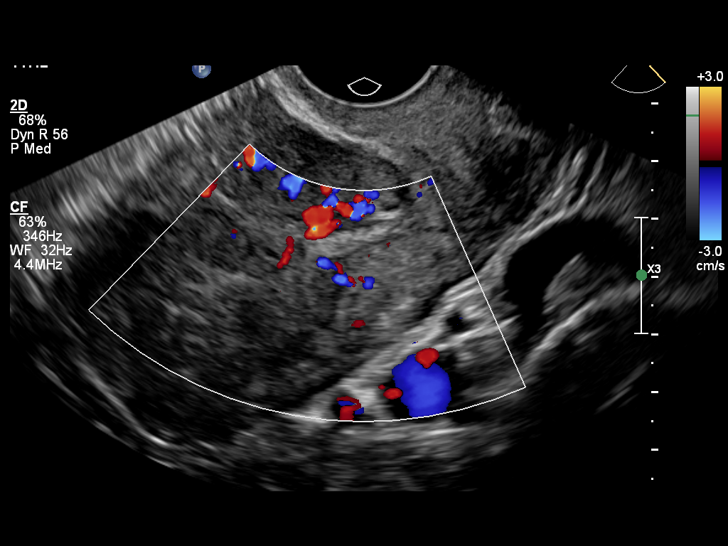
[im 39/39]
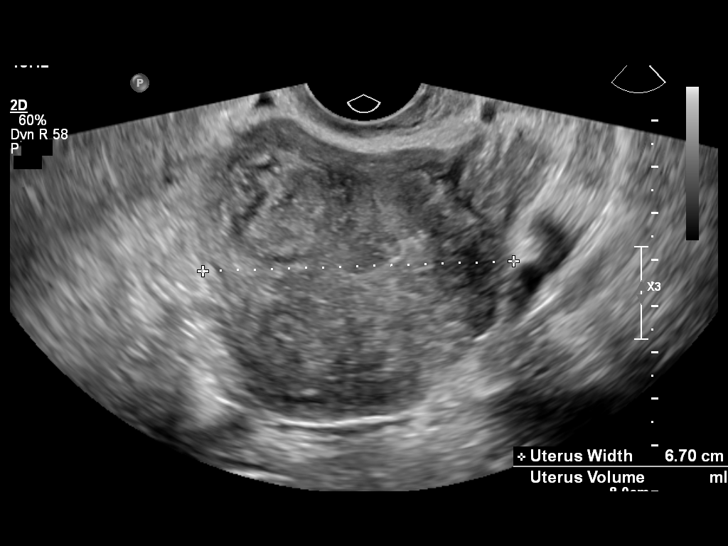

[14 of 25 positions shown; findings below may reference images not displayed]

This is a summary report. The complete report is available in the patient's medical record. If you cannot access the medical record, please contact the sending organization for a detailed fax or copy.
Uterus appears to contain a Rt Lateral fibroid measuring 4.2 x 2.7 x 3.6cm(Vascularity appears increased from previous ultrasound in 8488), a possible adenomyoma seen Posterior measuring 2.9 x 2.4 x 3.4cm
Endometrium appears thickened for postmenopausal state, active bleeding is seen
Ovaries appear normal
No fluid seen in the pelvis

## 2020-05-02 IMAGING — MG MAMMO BREAST DIAGNOSTIC TOMOSYNTHESIS BILATERAL
8 of 18 series · 8 of 40 positions shown · non-contrast
Comparison: none

This is a summary report. The complete report is available in the patient's medical record. If you cannot access the medical record, please contact the sending organization for a detailed fax or copy.
EXAM: DIAGNOSTIC BILATERAL MAMMOGRAM:
REFERENCE:  08/03/2011, 09/12/2015, 05/04/2018
CLINICAL INDICATION:  Right breast lump per patient, felt 8 months ago, with no lumps now. Right breast pain
TISSUE TYPE: Heterogeneously Dense

[R ML]
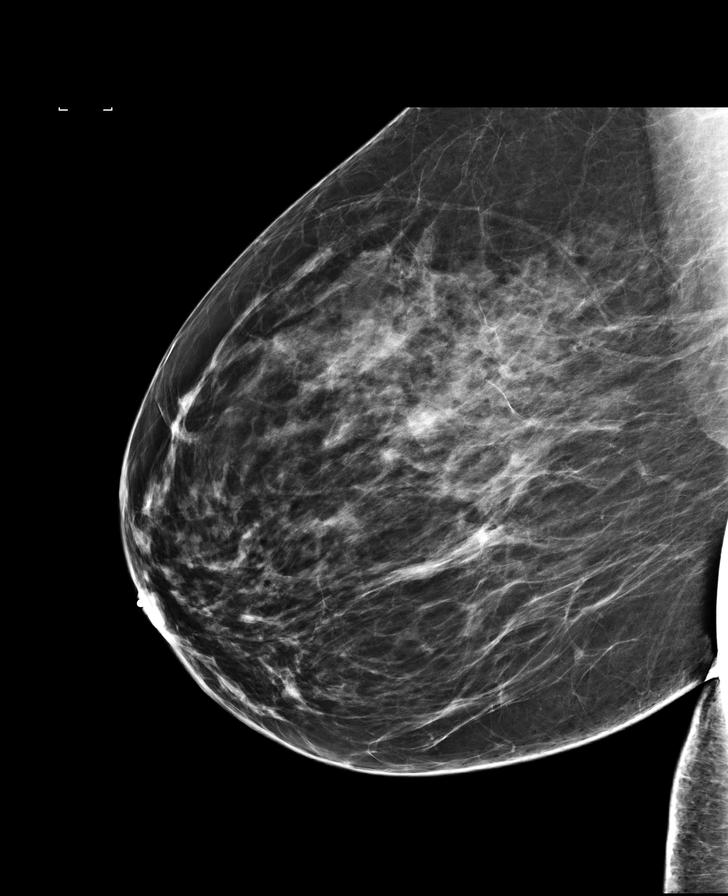

[R CC (1 of 2)]
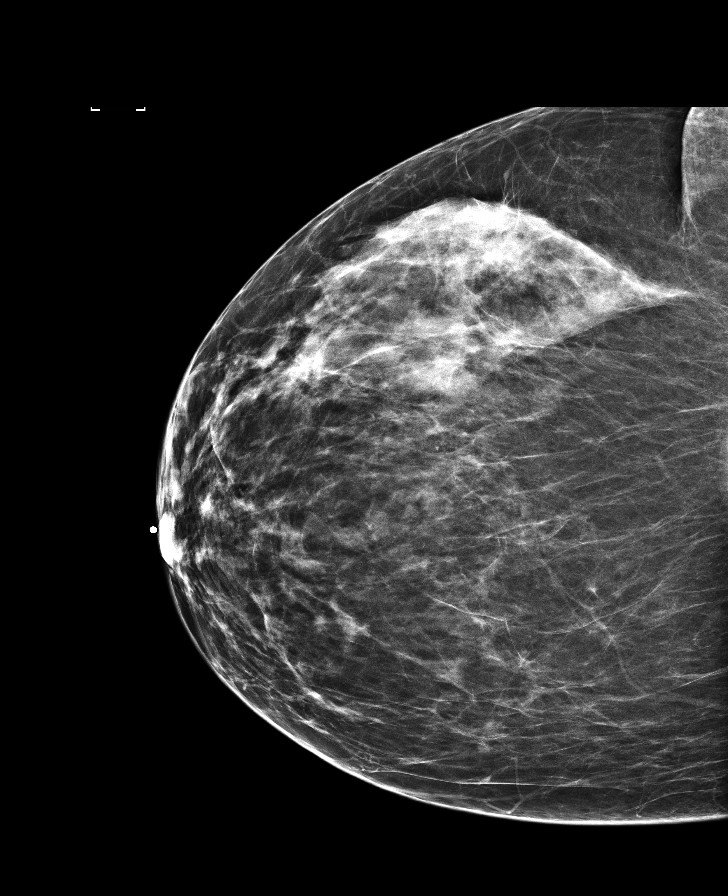

[R MLO (1 of 2)]
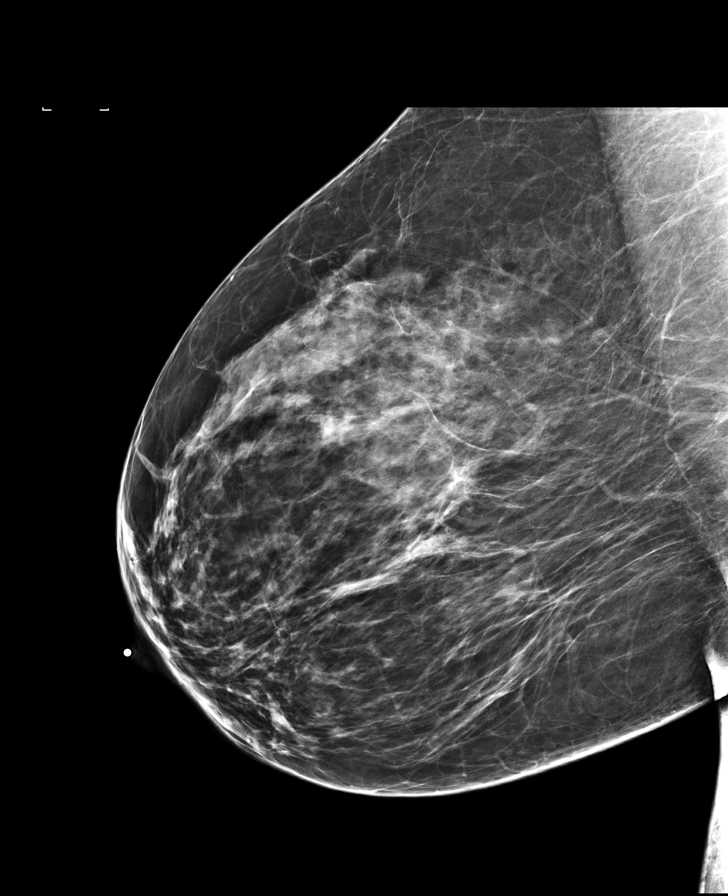

[R CC (2 of 2)]
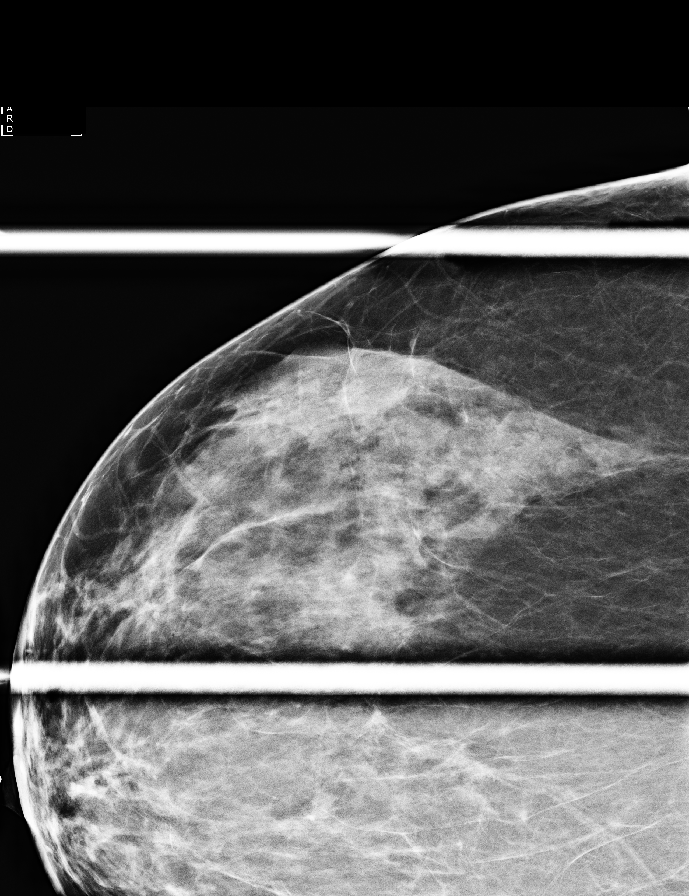

[L CC synth-2D]
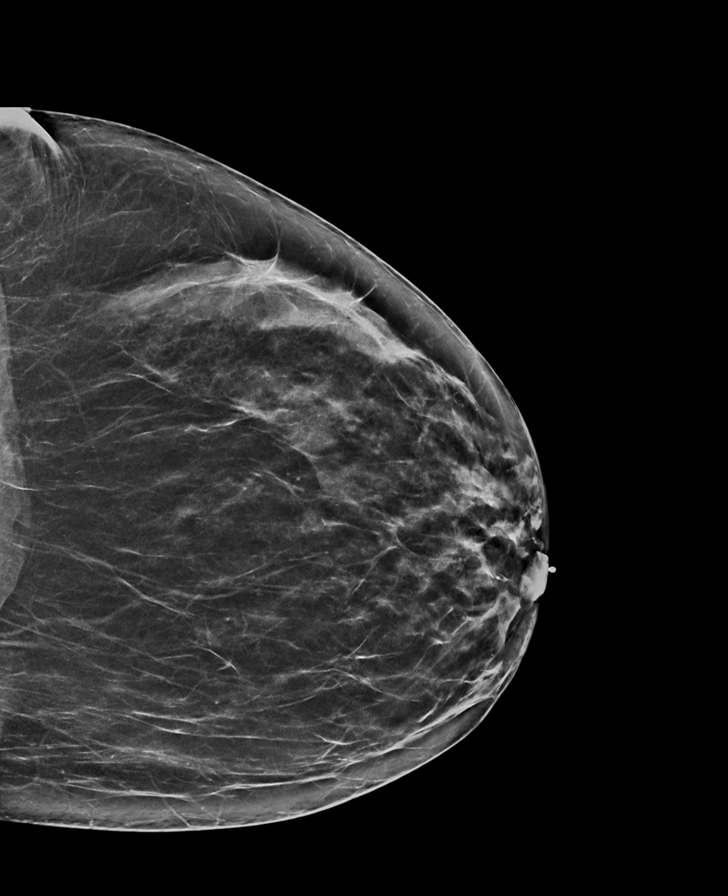

[R CC synth-2D]
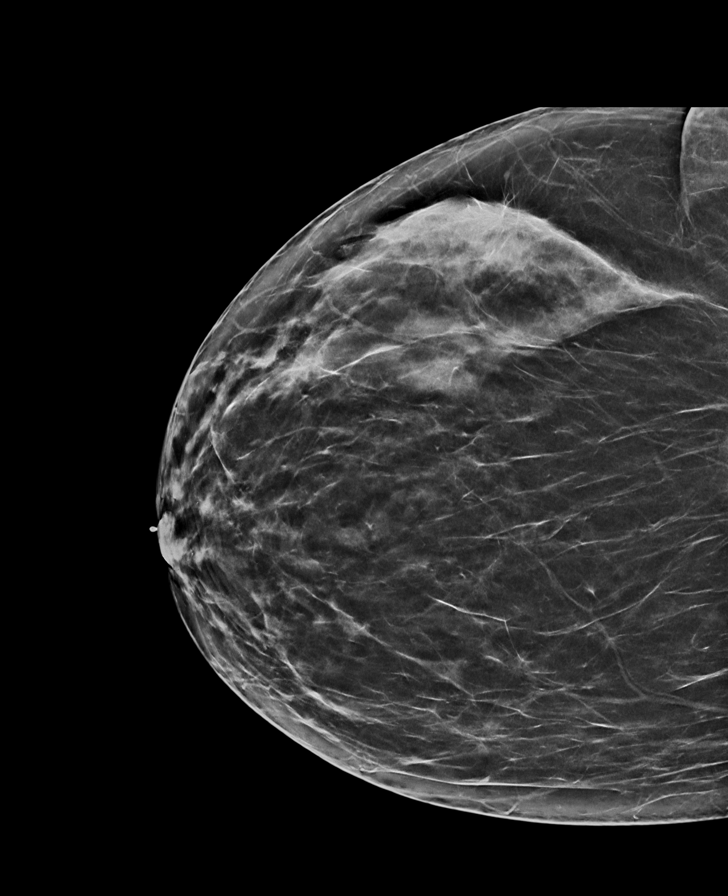

[R MLO (2 of 2)]
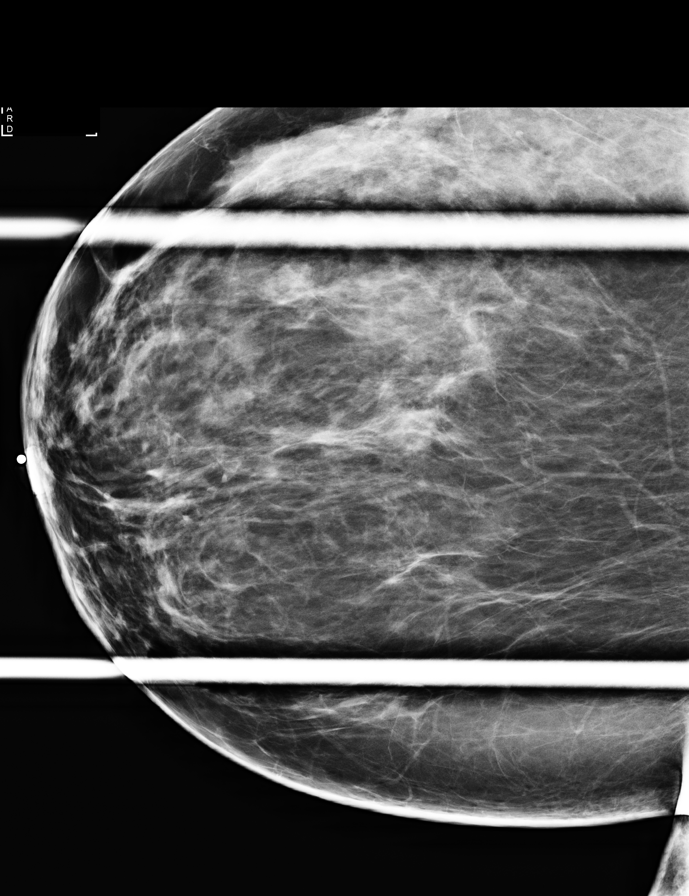

[L CC]
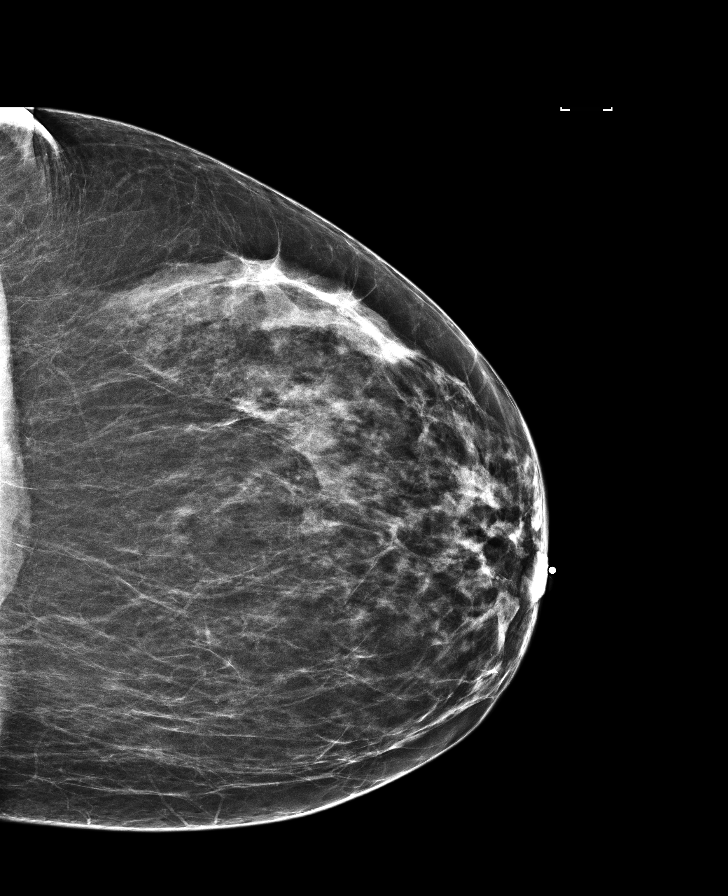

[8 of 40 positions shown; findings below may reference images not displayed]

FINDINGS: There is no asymmetric density, mass or architectural distortion in either breast. There are no suspicious microcalcifications. There is no skin thickening or nipple retraction of either breast.
EXAM:  DIAGNOSTIC RIGHT BREAST ULTRASOUND:
Ultrasound of the right breast was performed from [DATE] to [DATE] to evaluate the area of pain and previously noted mass.
The breast tissue appears sonographically normal. There are no solid or cystic masses. There is no abnormal shadowing. There are no dilated ducts.
IMPRESSION: Stable with no mammographic or sonographic evidence of malignancy.
ACR BIRADS CATEGORY: 2: Benign
RECOMMENDATION: Recommend bilateral annual screening mammography unless otherwise clinically indicated.
Is the patient pregnant?
No

## 2020-05-02 IMAGING — US BREAST ULTRASOUND RIGHT LIMITED
1 series · 13 of 13 positions shown · non-contrast
Comparison: none

This is a summary report. The complete report is available in the patient's medical record. If you cannot access the medical record, please contact the sending organization for a detailed fax or copy.
EXAM: DIAGNOSTIC BILATERAL MAMMOGRAM:
REFERENCE:  08/03/2011, 09/12/2015, 05/04/2018
CLINICAL INDICATION:  Right breast lump per patient, felt 8 months ago, with no lumps now. Right breast pain
TISSUE TYPE: Heterogeneously Dense

[Series 1: breast ultrasound right limited · 13 of 13 slices shown]
[im 1/13]
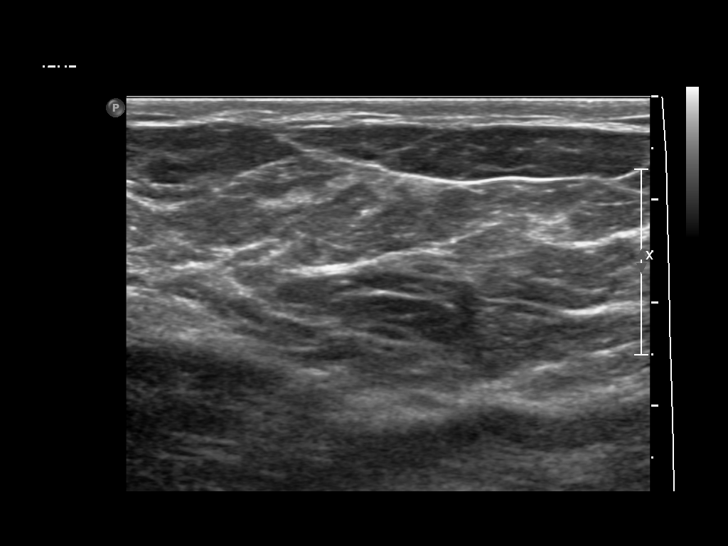
[im 2/13]
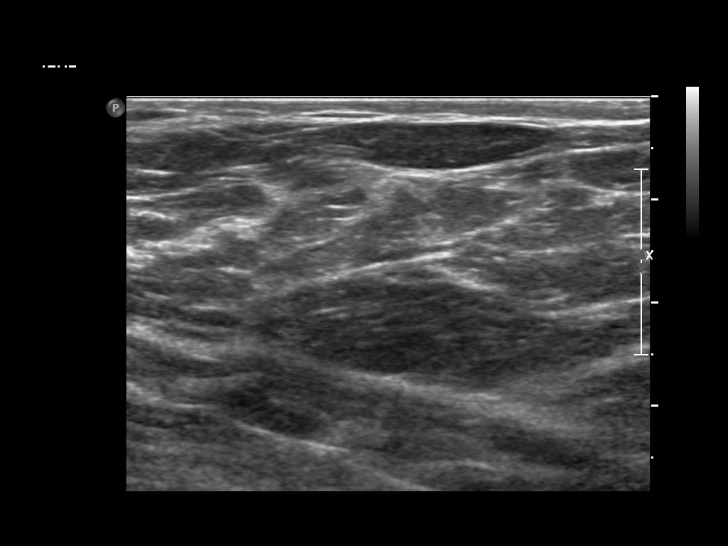
[im 3/13]
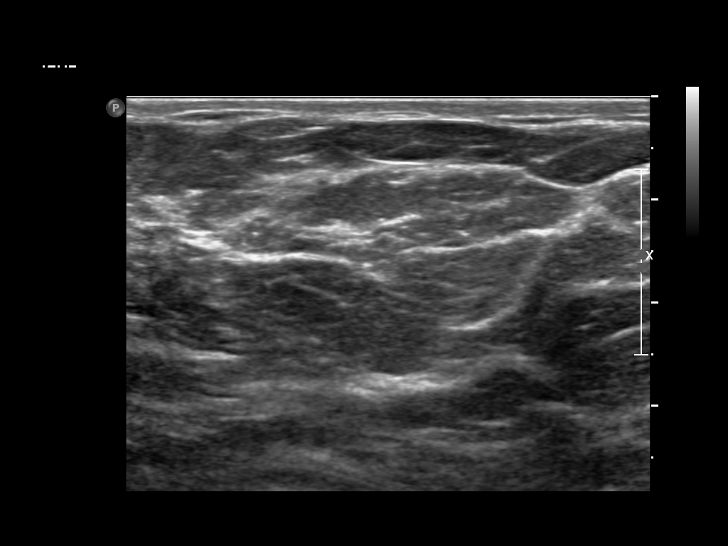
[im 4/13]
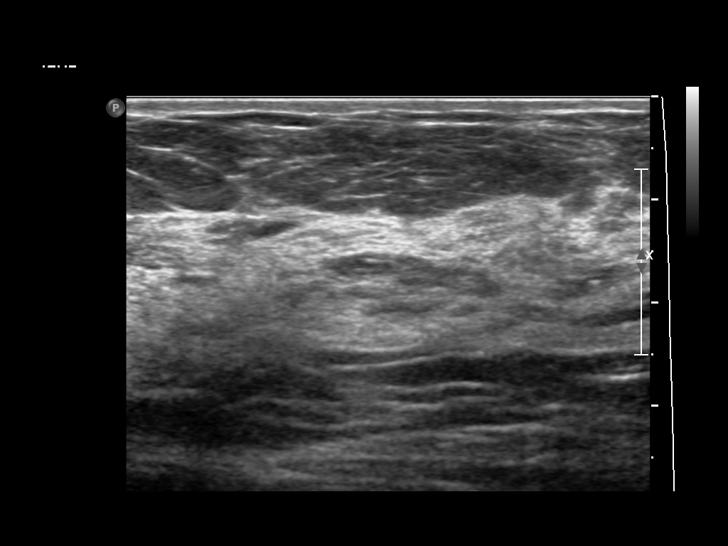
[im 5/13]
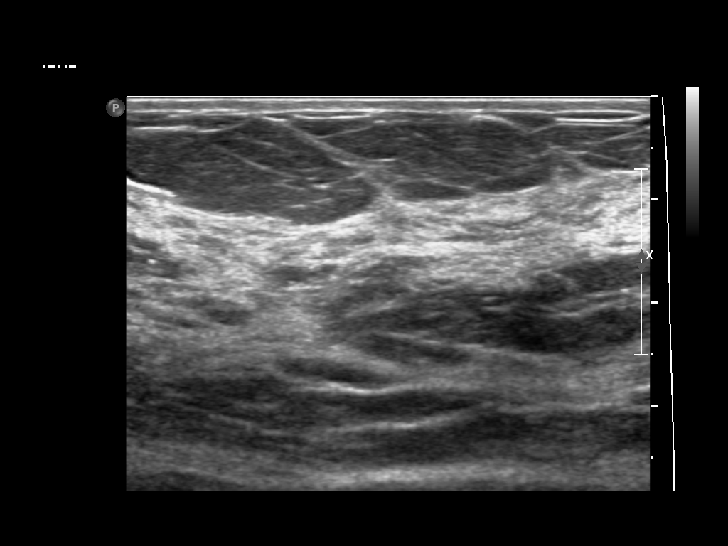
[im 6/13]
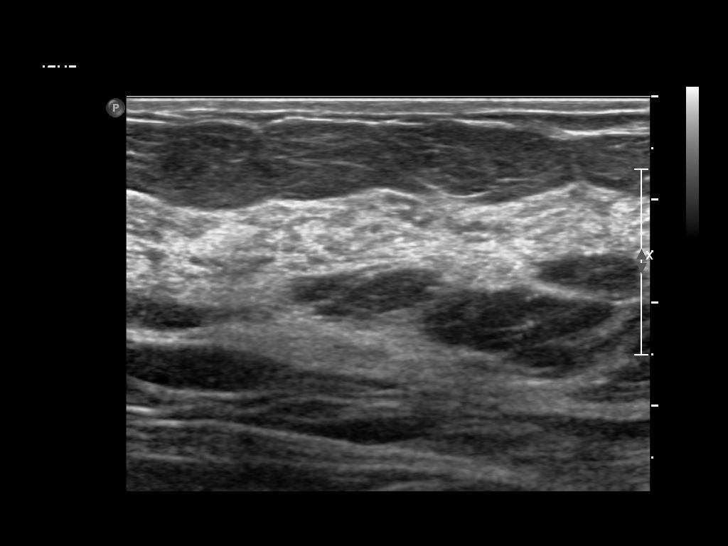
[im 7/13]
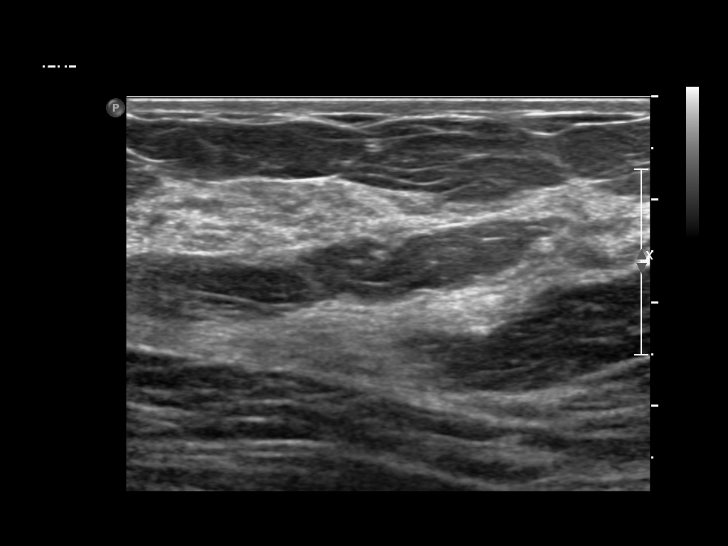
[im 8/13]
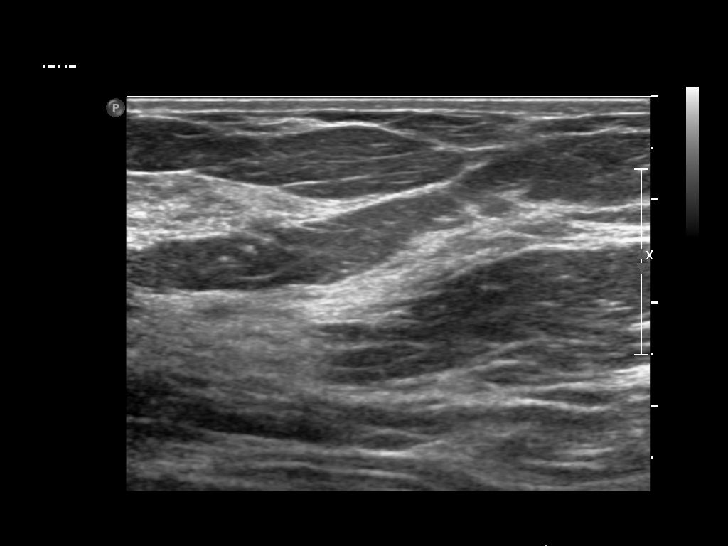
[im 9/13]
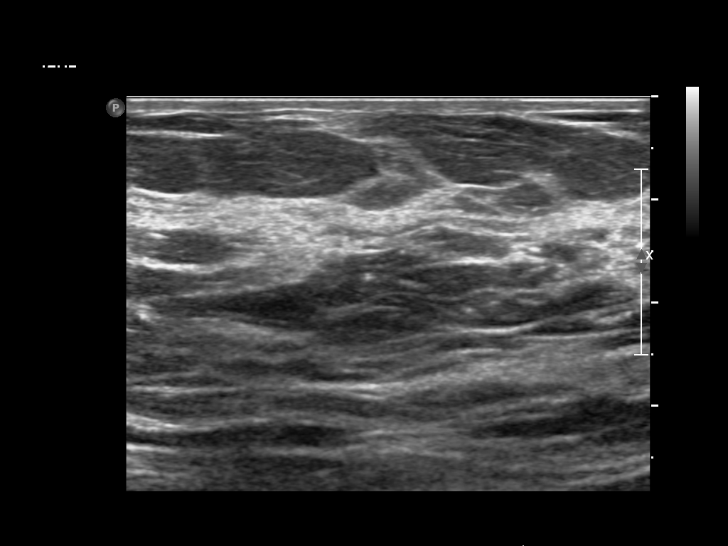
[im 10/13]
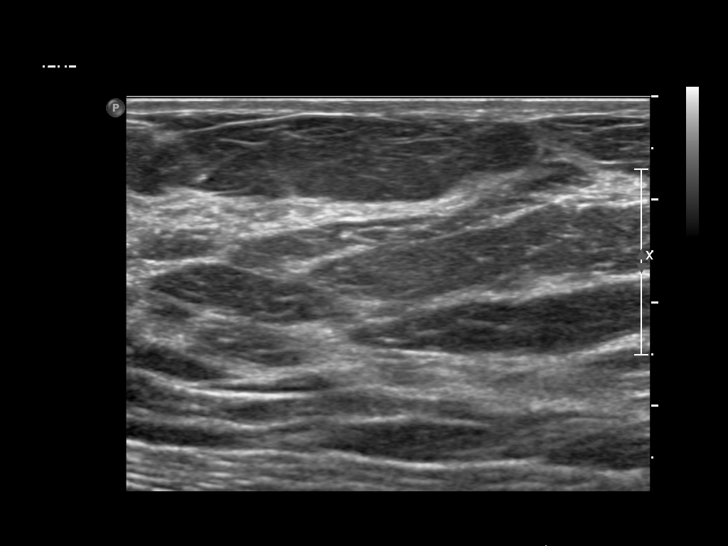
[im 11/13]
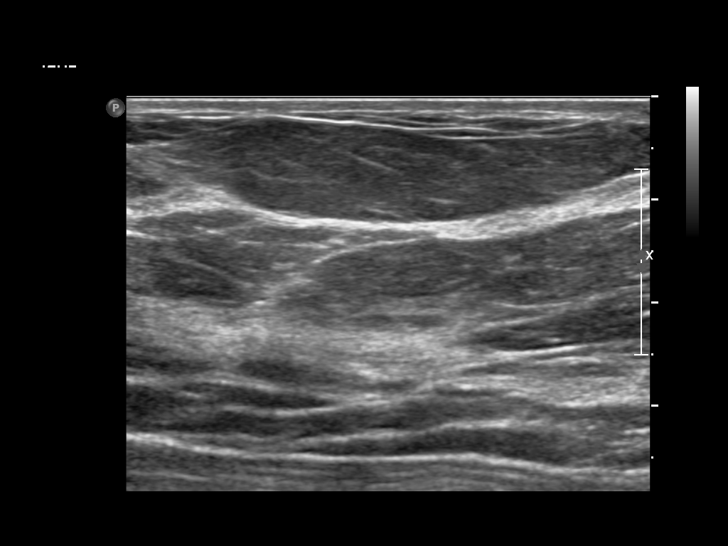
[im 12/13]
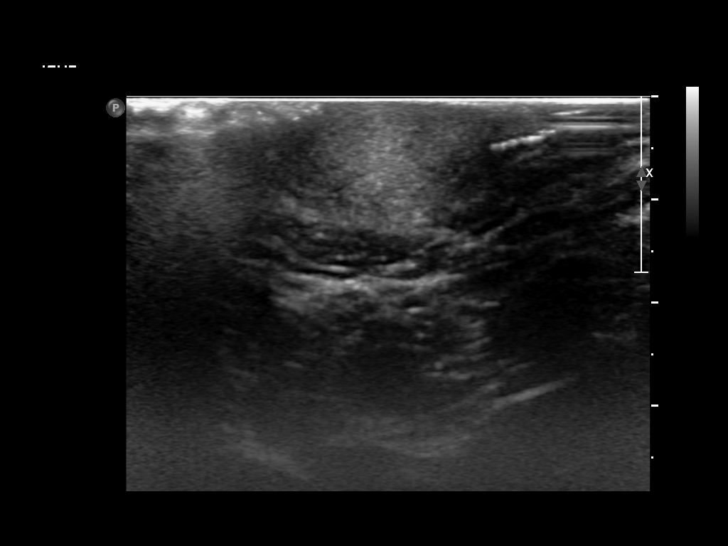
[im 13/13]
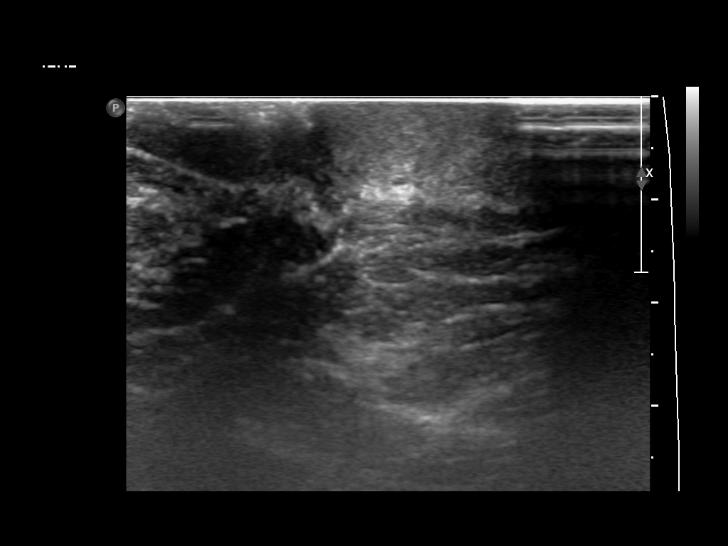

[13 of 13 positions shown; findings below may reference images not displayed]

FINDINGS: There is no asymmetric density, mass or architectural distortion in either breast. There are no suspicious microcalcifications. There is no skin thickening or nipple retraction of either breast.
EXAM:  DIAGNOSTIC RIGHT BREAST ULTRASOUND:
Ultrasound of the right breast was performed from [DATE] to [DATE] to evaluate the area of pain and previously noted mass.
The breast tissue appears sonographically normal. There are no solid or cystic masses. There is no abnormal shadowing. There are no dilated ducts.
IMPRESSION: Stable with no mammographic or sonographic evidence of malignancy.
ACR BIRADS CATEGORY: 2: Benign
RECOMMENDATION: Recommend bilateral annual screening mammography unless otherwise clinically indicated.

## 2020-08-24 IMAGING — DX XR CHEST 1 VIEW
1 series · 1 of 1 positions shown · non-contrast
Comparison: none

Exam: Chest one view
Today's exam is compared to the previous study of 08/28/2016.
Heart is normal in size. Mediastinal contours within normal limits. Trachea is midline. Lungs are clear bilaterally. Bony structures are grossly intact.

[AP]
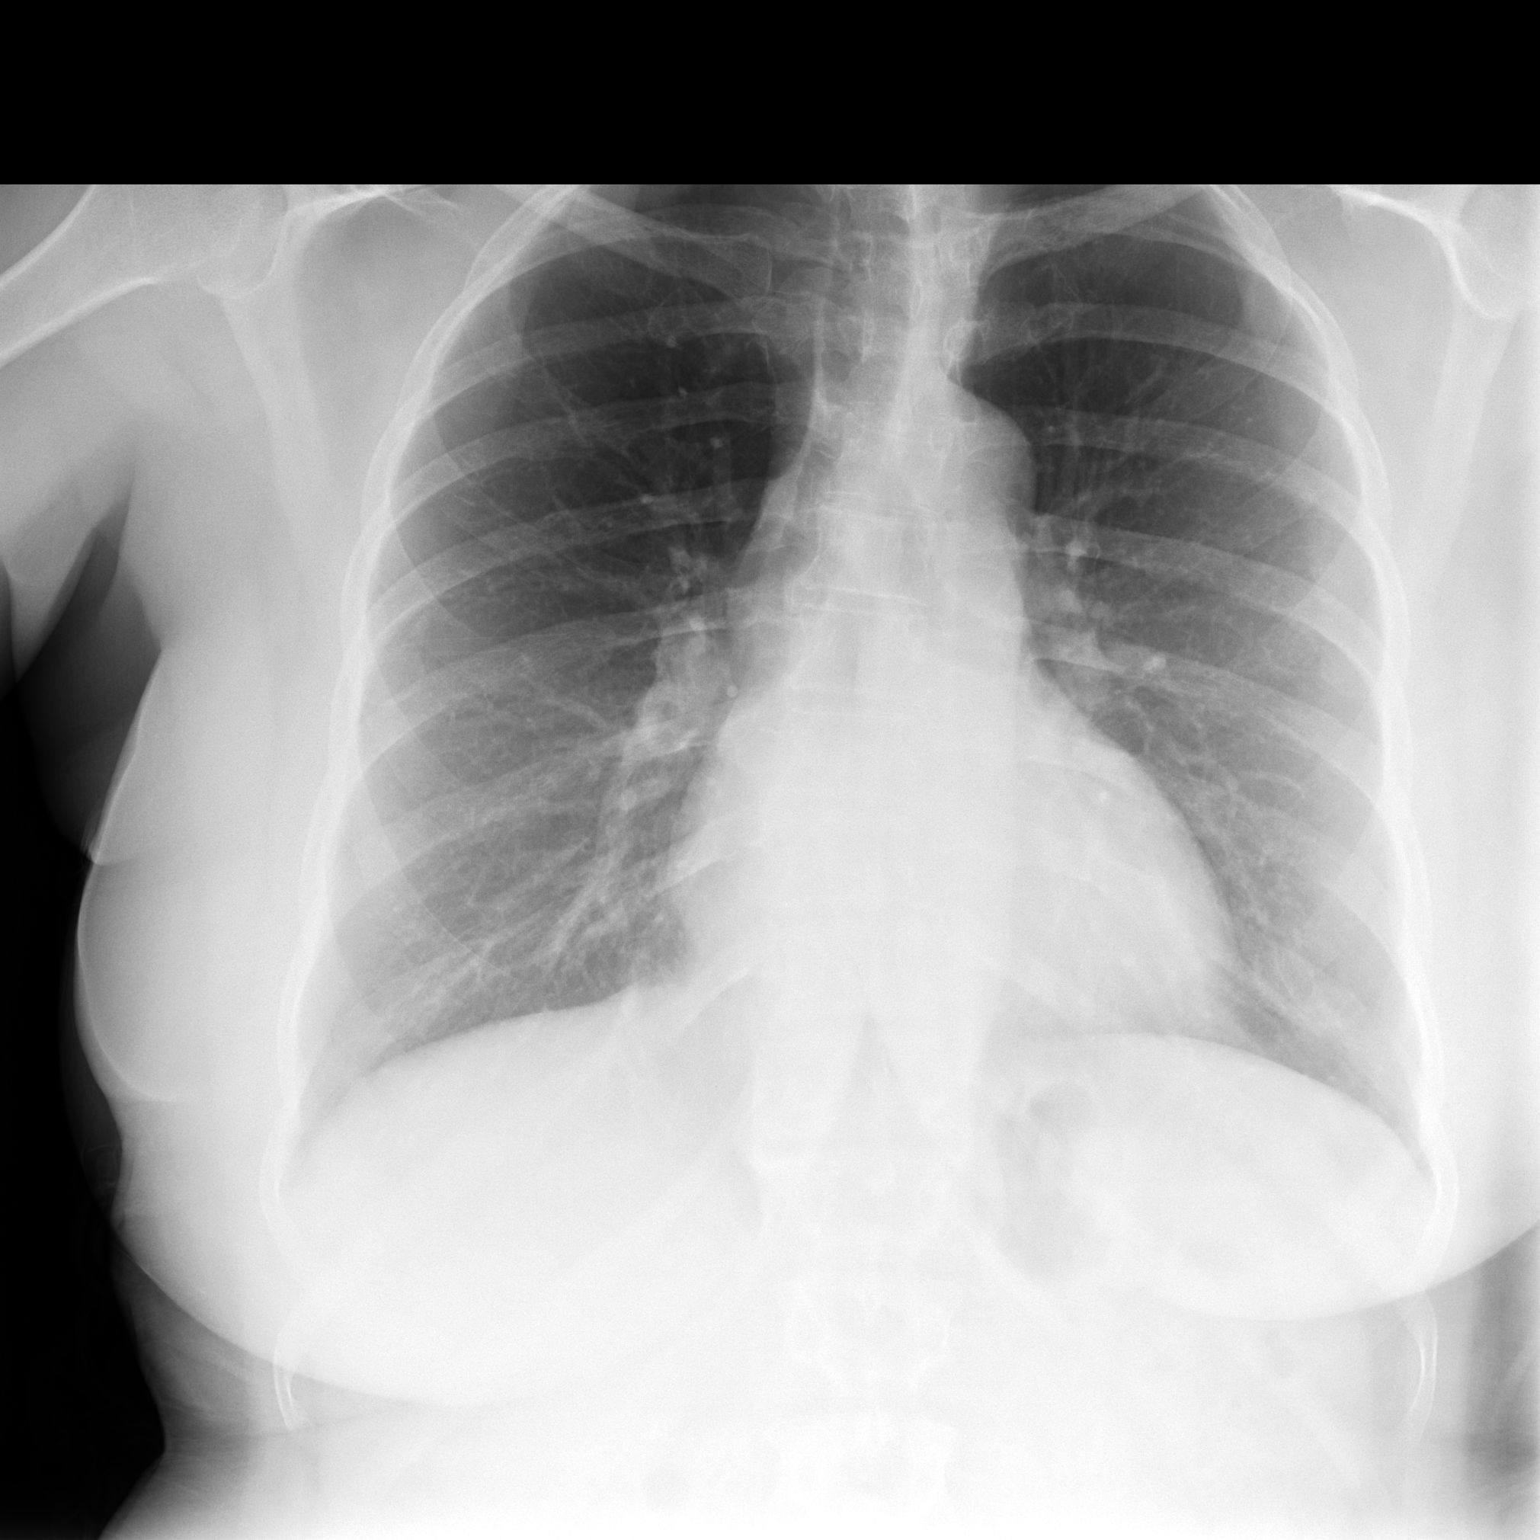

[1 of 1 positions shown; findings below may reference images not displayed]

IMPRESSION: No acute cardia pulmonary process.
Is the patient pregnant?
No

## 2020-10-02 IMAGING — CT CT CHEST PULMONARY EMBOLISM W IV CONTRAST
2 of 6 series · 13 of 36 positions shown · IV contrast (agent unspecified)
Comparison: none

EXAM: CT OF THE CHEST WITH CONTRAST:
Location number is 10
Medical indication:Patient complains of coughing and shortness of breath and status post laparoscopic hysterectomy on 09/24/2020.
Reference:None
TECHNIQUE: Informed consent was obtained for the administration of contrast.  Multiple axial images were obtained through the chest following the uneventful administration of intravenous contrast.
Images are obtained not using a pulmonary arteriogram protocol.

[Series 2: pe chest · axial · 0.77mm/px · z∈[-276,+61]mm · 10 of 166 slices shown]
[im 16/166  lung]
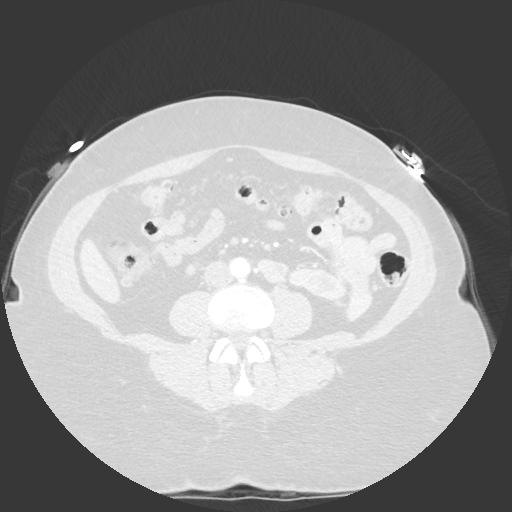
[im 31/166  mediastinal]
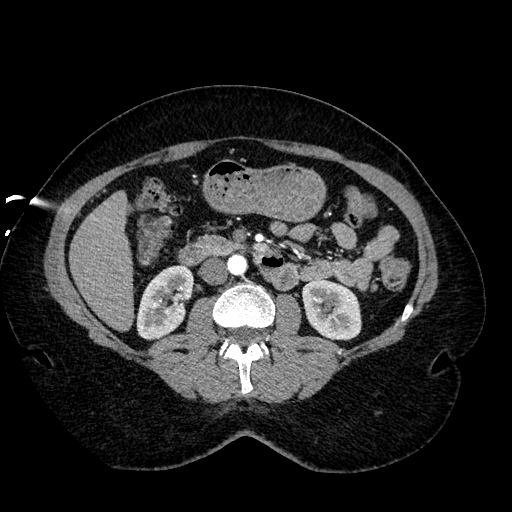
[im 46/166  lung]
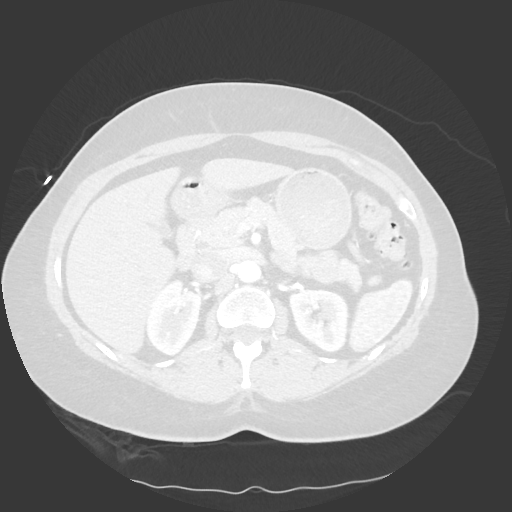
[im 61/166  mediastinal]
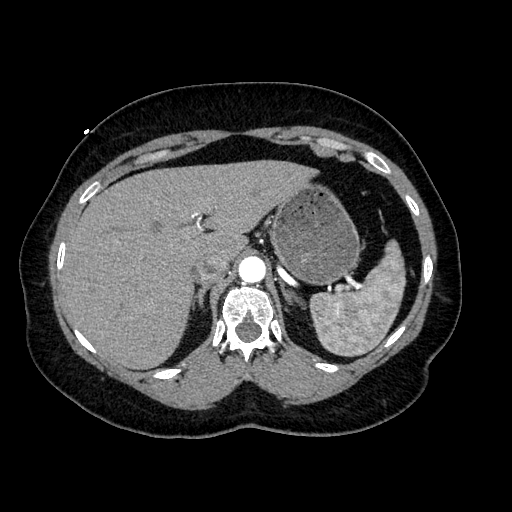
[im 76/166  lung]
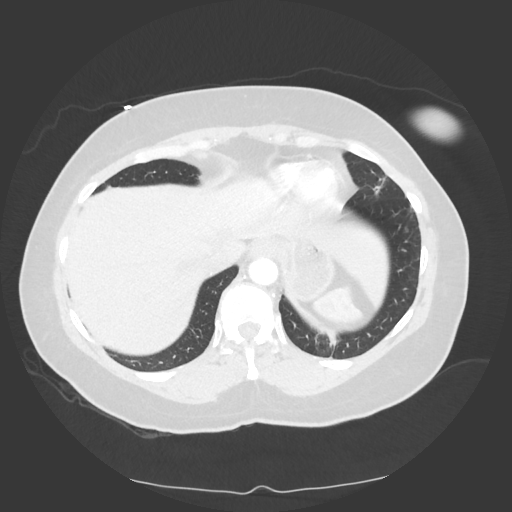
[im 91/166  mediastinal]
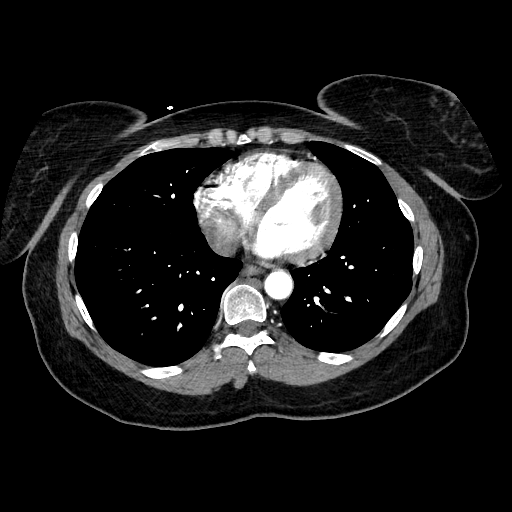
[im 106/166  lung]
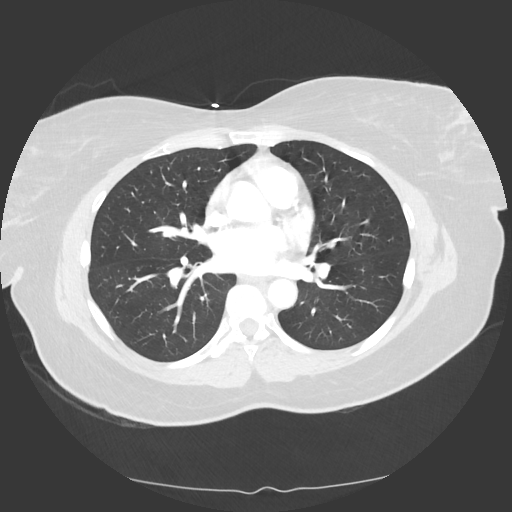
[im 121/166  mediastinal]
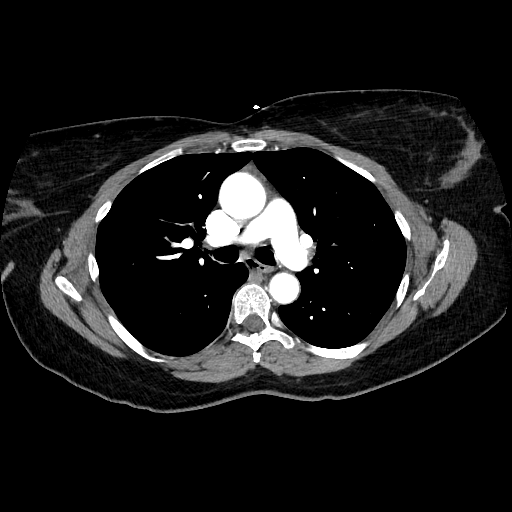
[im 136/166  lung]
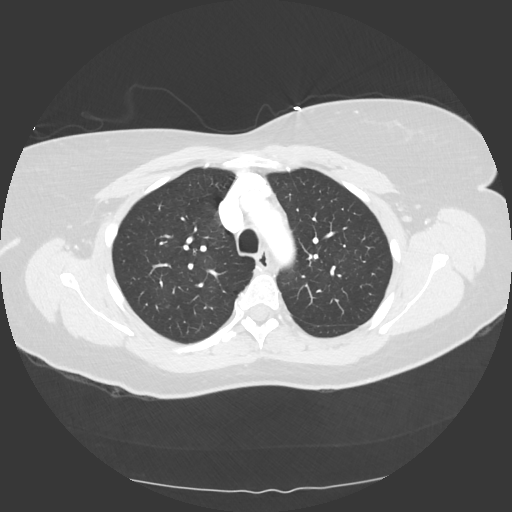
[im 151/166  mediastinal]
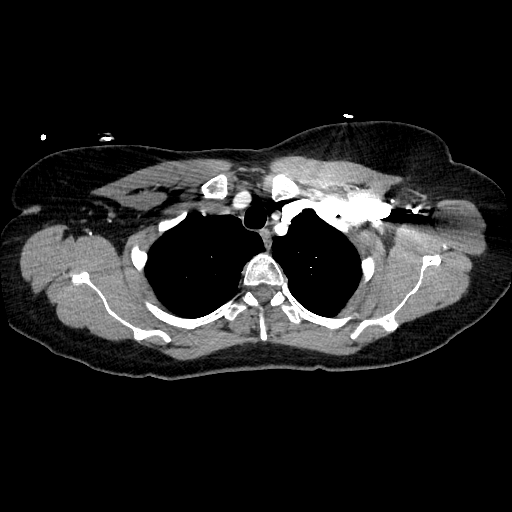

[Series 601: sag standard 2x2 · sagittal · 0.81mm/px · 3 of 197 slices shown]
[im 40/197  mediastinal]
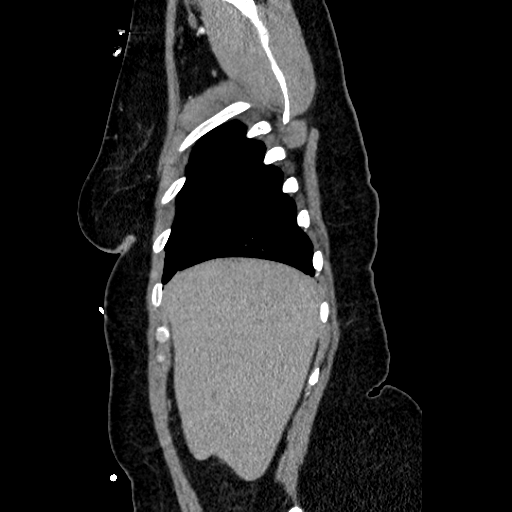
[im 79/197  mediastinal]
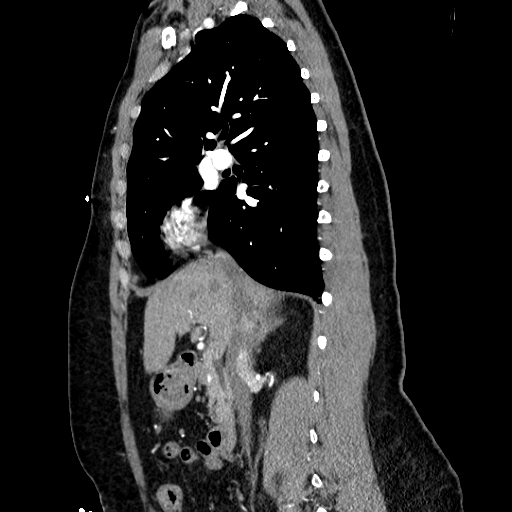
[im 118/197  mediastinal]
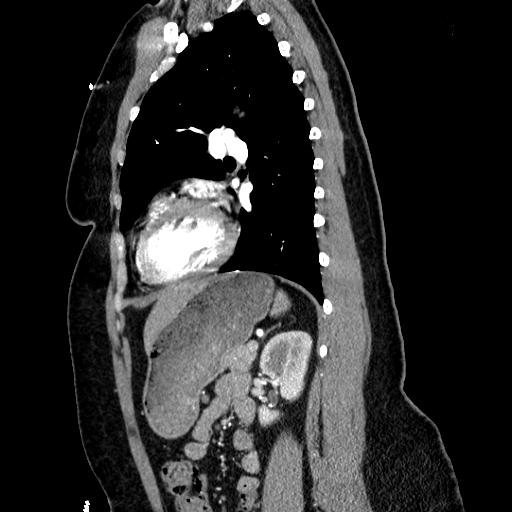

[13 of 36 positions shown; findings below may reference images not displayed]

FINDINGS: Lungs are clear.
There is no evidence of pulmonary embolism.
There is no pleural effusion.
Heart size is normal. There is no pericardial effusion.
Bones are intact.
No pathologic lymph node enlargement.
Upper abdominal viscera appear unremarkable.
IMPRESSION: Lungs are clear.
No evidence for pulmonary embolism.
Is the patient pregnant?
No

## 2021-02-26 IMAGING — DX XR LUMBAR SPINE 2-3 VIEWS
1 series · 3 of 3 positions shown · non-contrast
Comparison: none

Exam: LUMBAR SPINE:
CLINICAL INDICATION:  low back pain
REFERENCE:  None

[Series 4500: AP · U · 0.19mm/px · 3 of 3 slices shown]
[im 1/3]
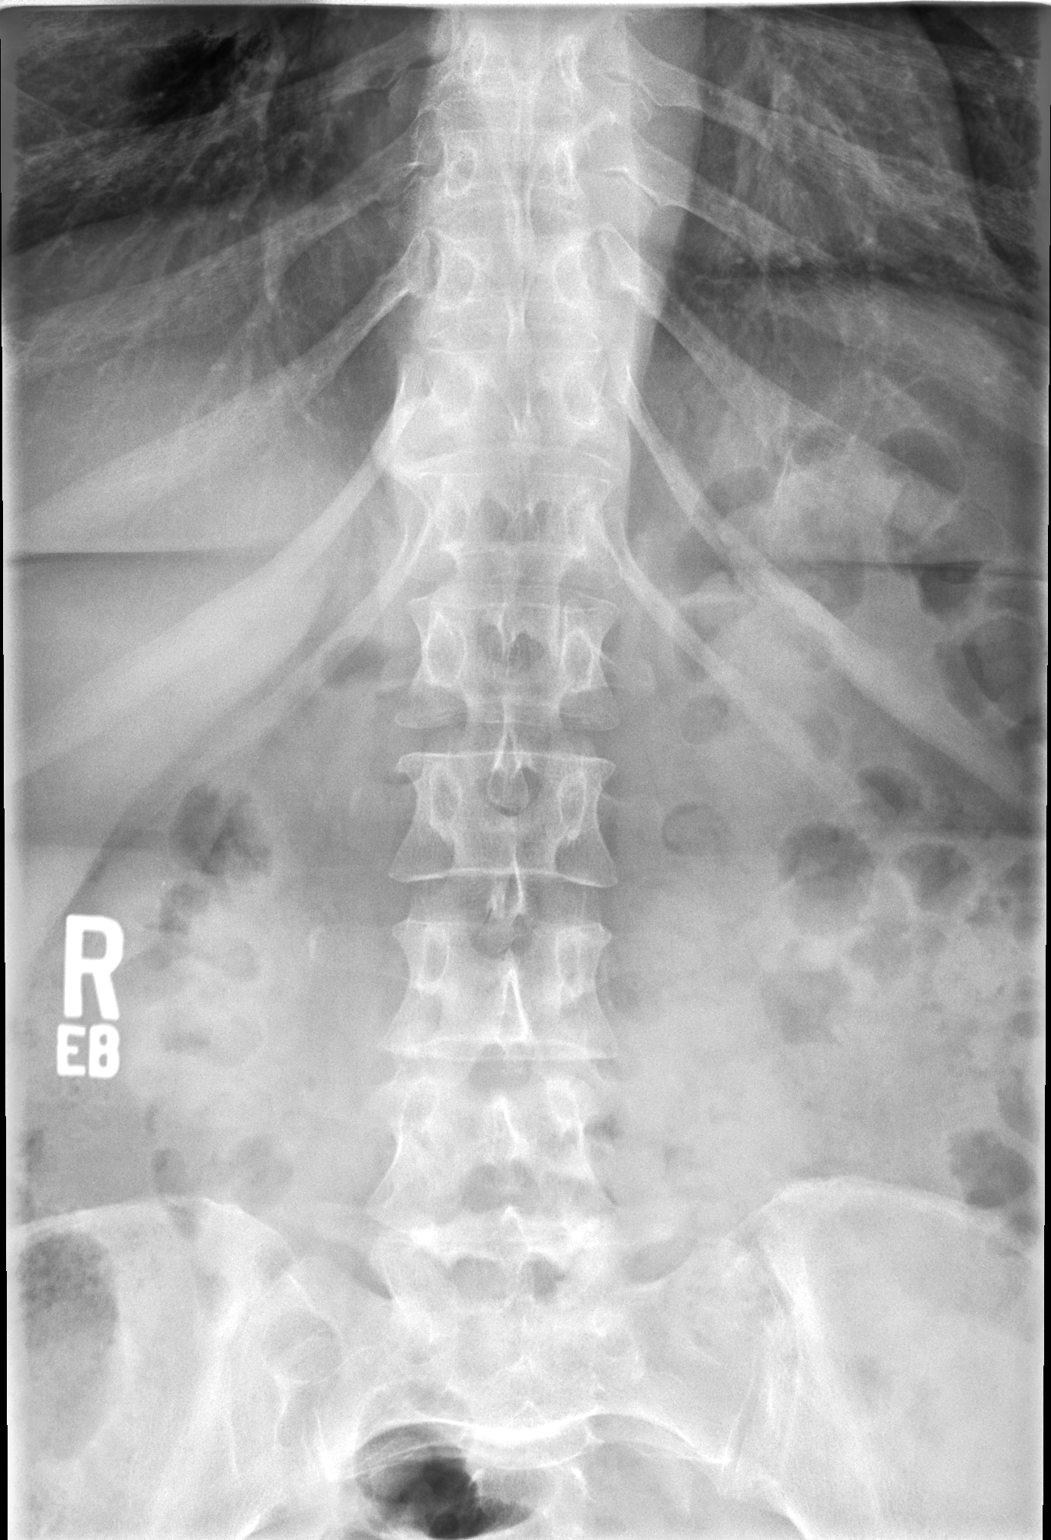
[im 2/3]
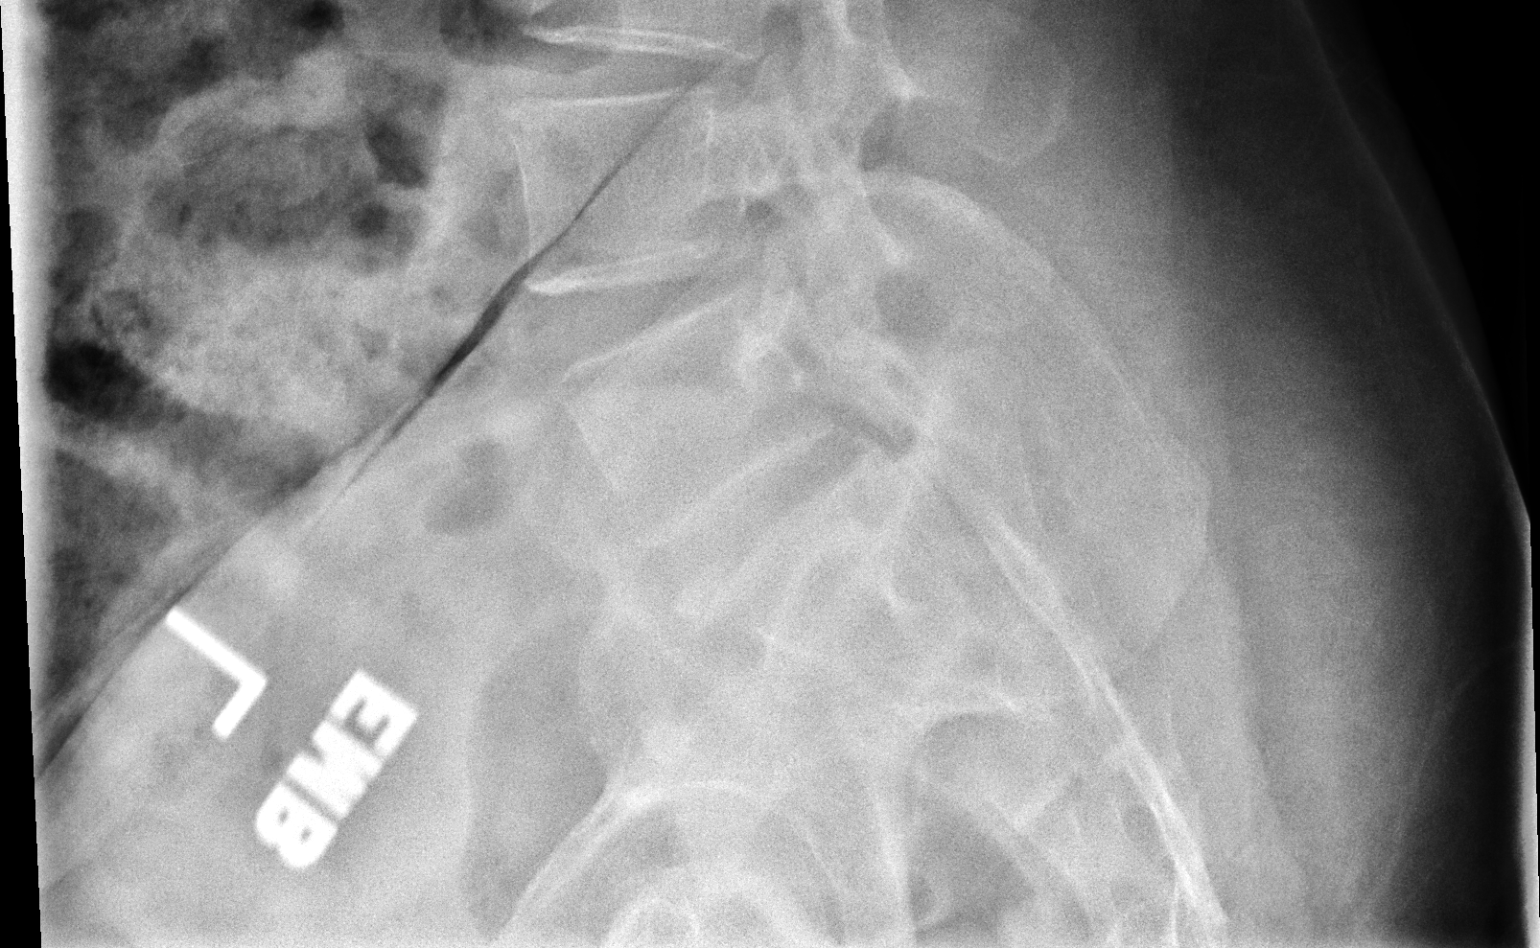
[im 3/3]
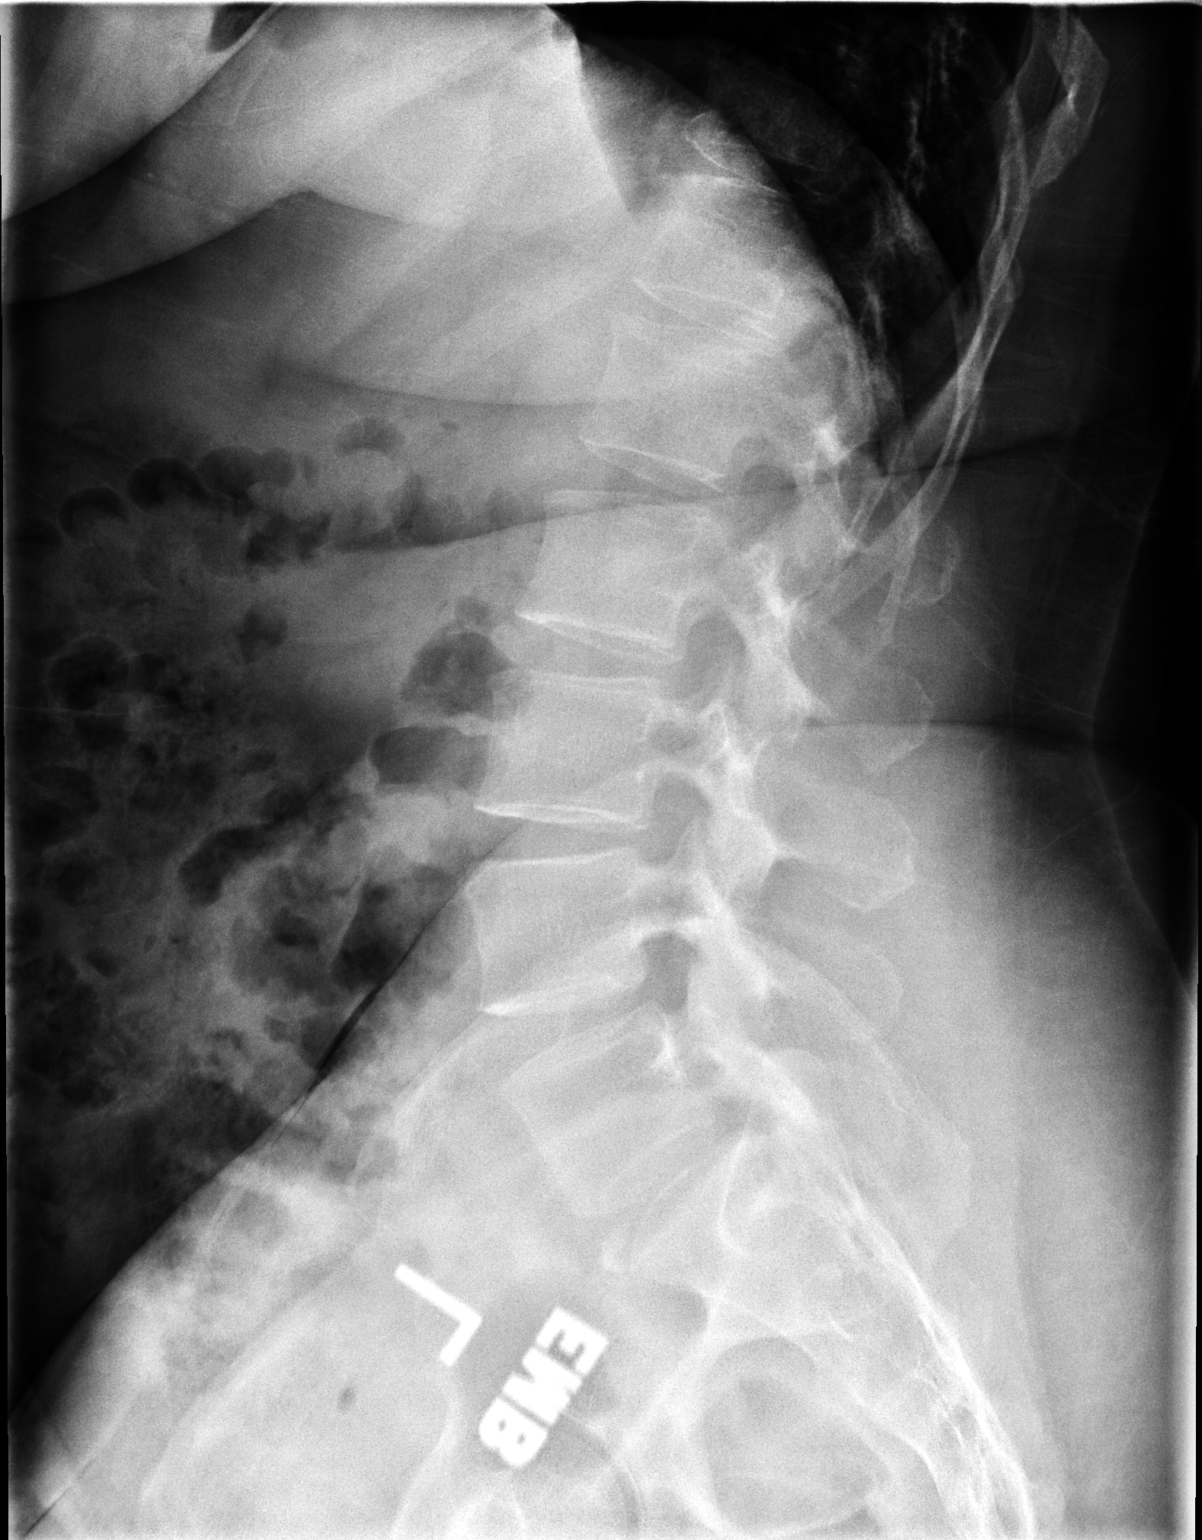

[3 of 3 positions shown; findings below may reference images not displayed]

FINDINGS: 3 Radiographs of the lumbosacral spine were obtained.
Alignment shows slight anterolisthesis of L5 on S1. Vertebral body heights and disc heights are normal. Facet degenerative changes at the L5-S1 level and also at L4-5. Visualized sacrum and iliac bones are intact.
IMPRESSION: Mild anterolisthesis of L5 on S1.
LOCATION CODE: 1
Is the patient pregnant?
No

## 2021-02-26 IMAGING — DX XR PELVIS 1-2 VIEWS
1 series · 2 of 2 positions shown · non-contrast
Comparison: none

EXAM:  PELVIS:
CLINICAL INDICATION: b/l hip pain s/p fall

[Series 4506: AP · U · 0.19mm/px · 2 of 2 slices shown]
[im 1/2]
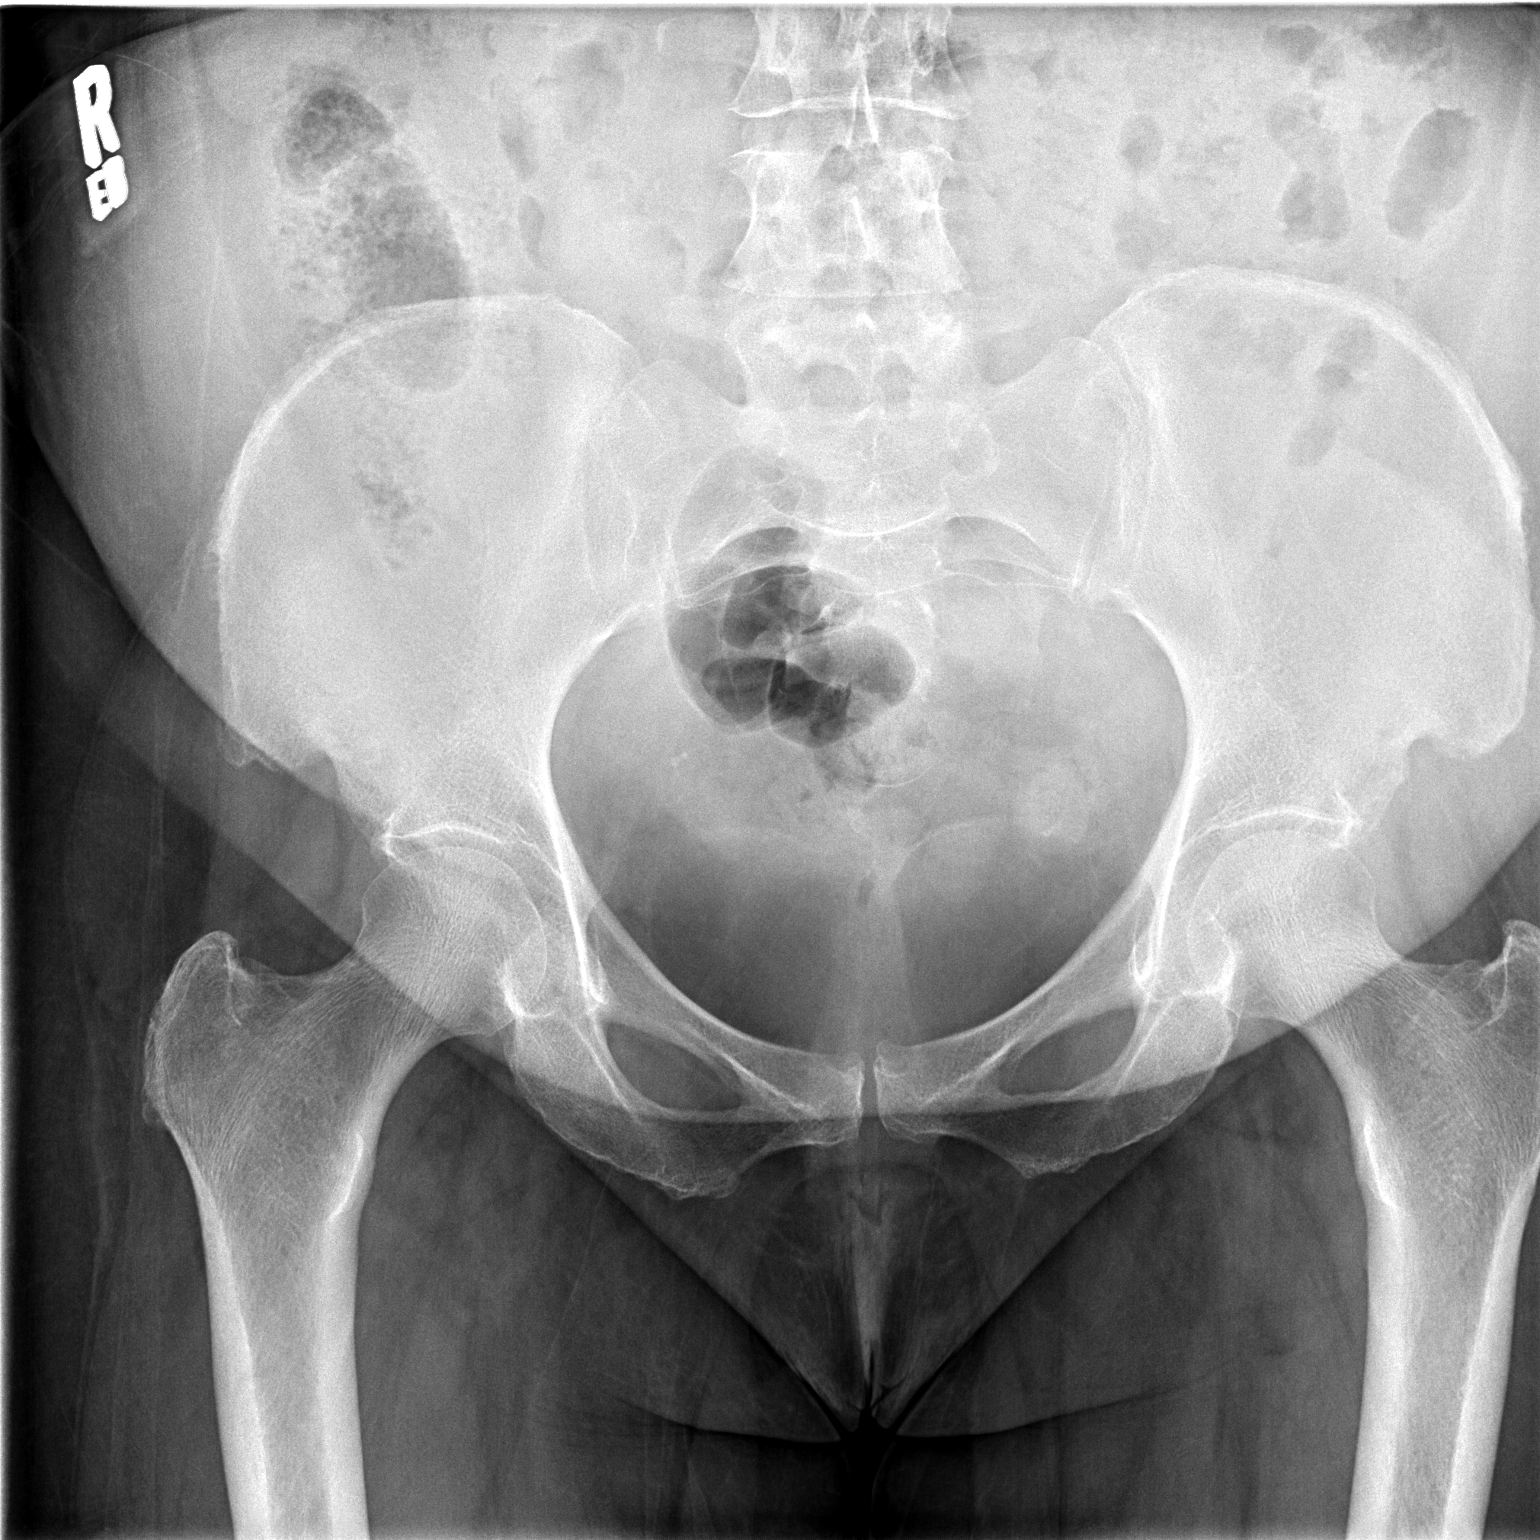
[im 2/2]
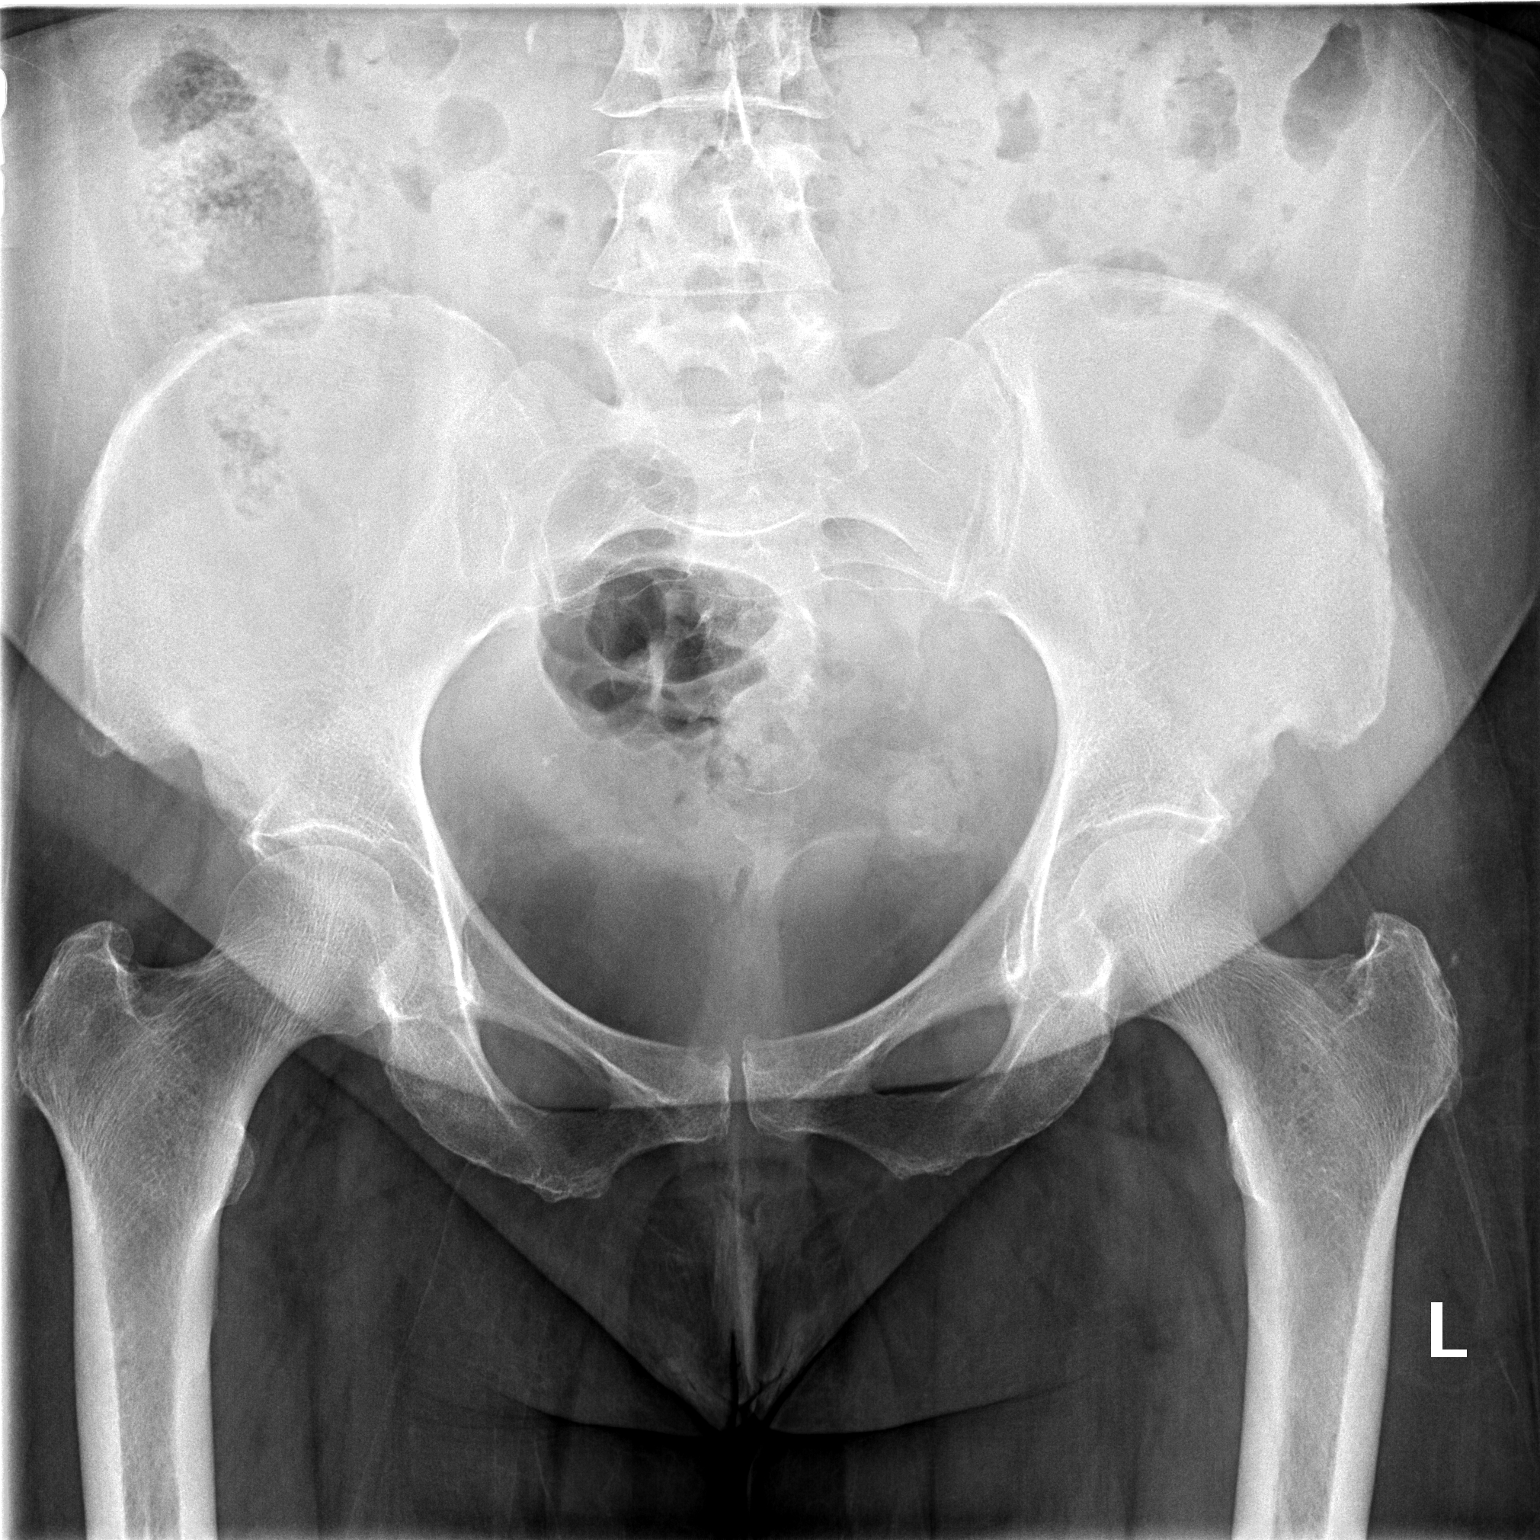

[2 of 2 positions shown; findings below may reference images not displayed]

FINDINGS: Single frontal pelvic radiograph demonstrates no acute fracture or dislocation.  Surrounding soft tissue structures are unremarkable.
IMPRESSION: Unremarkable examination
LOCATION CODE: 1
Is the patient pregnant?
No

## 2021-08-27 IMAGING — US US LOW EXT VEINS LEFT
1 series · 14 of 16 positions shown · non-contrast
Comparison: None
Multiple grayscale and duplex images of the left lower extremity were obtained.

FINAL REPORT:
Left lower extremity venous duplex:
Clinical indication: Left leg pain

[Series 1: us low ext veins left · 14 of 40 slices shown]
[im 1/40]
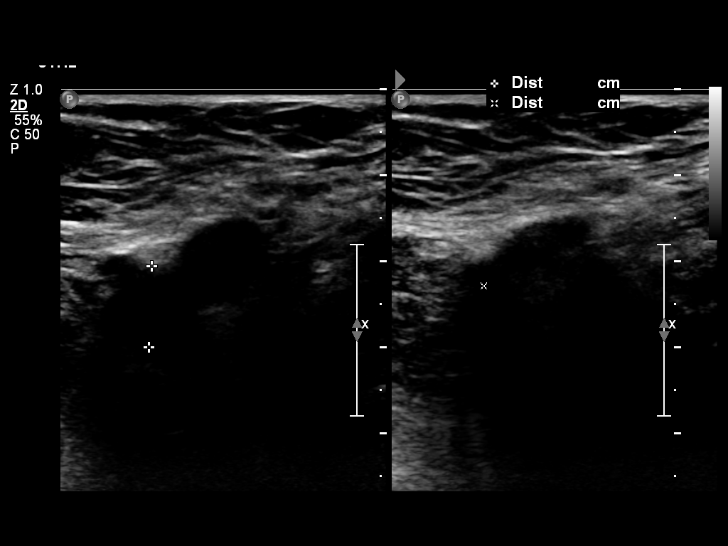
[im 3/40]
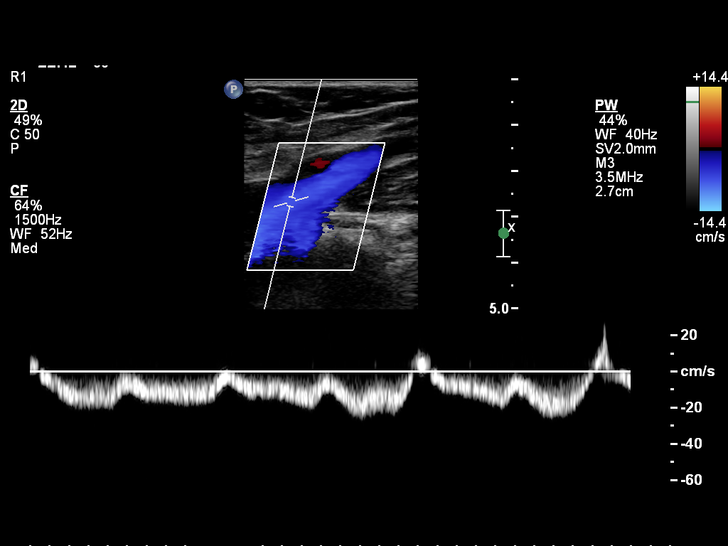
[im 6/40]
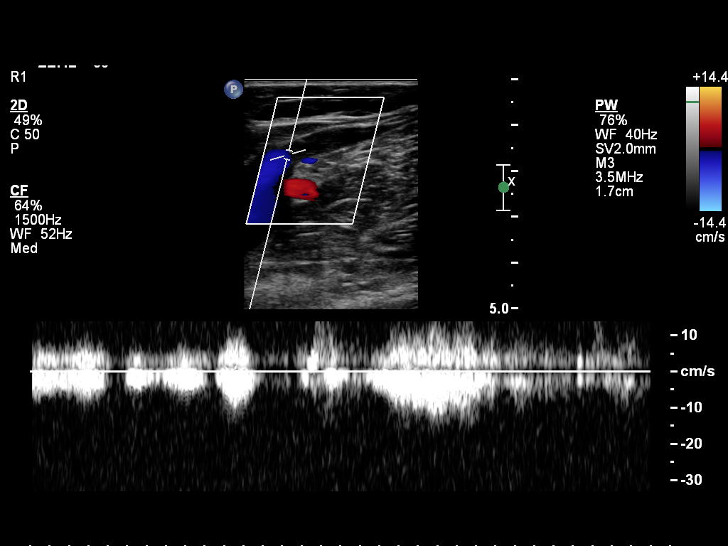
[im 11/40]
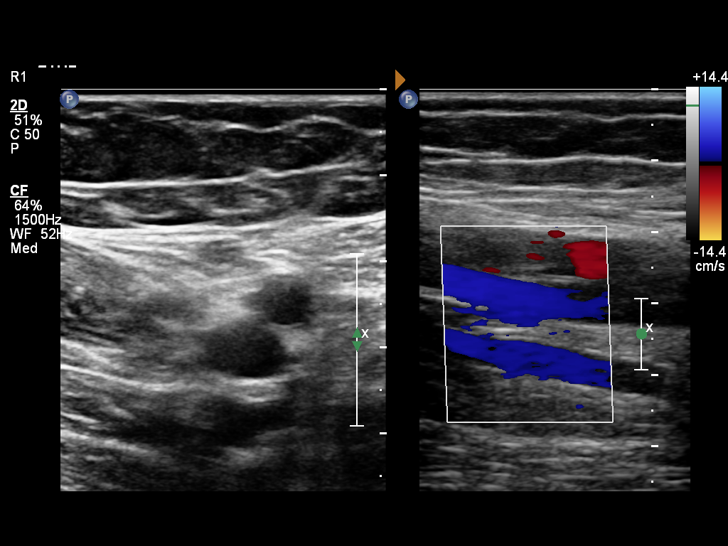
[im 14/40]
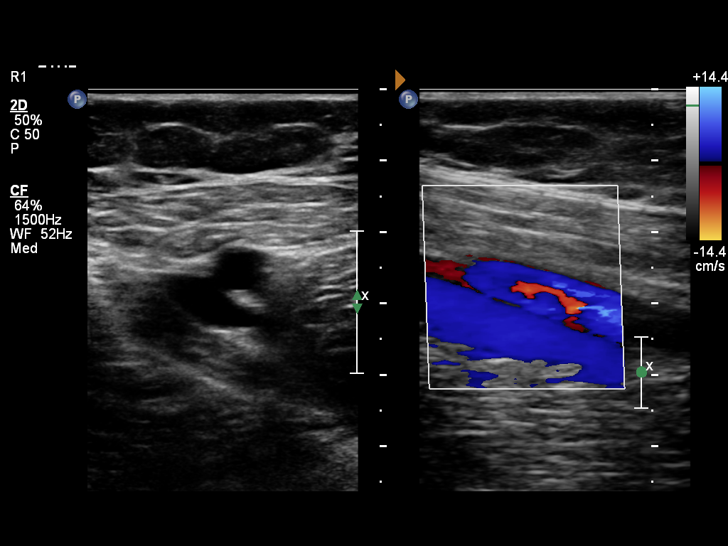
[im 16/40]
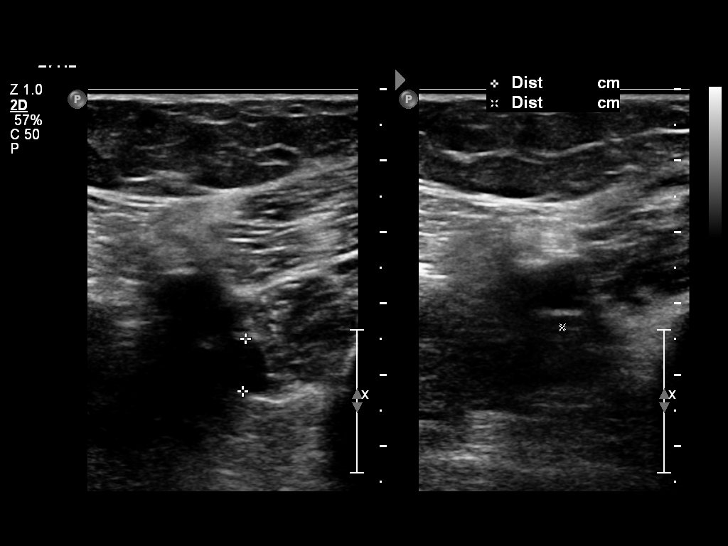
[im 19/40]
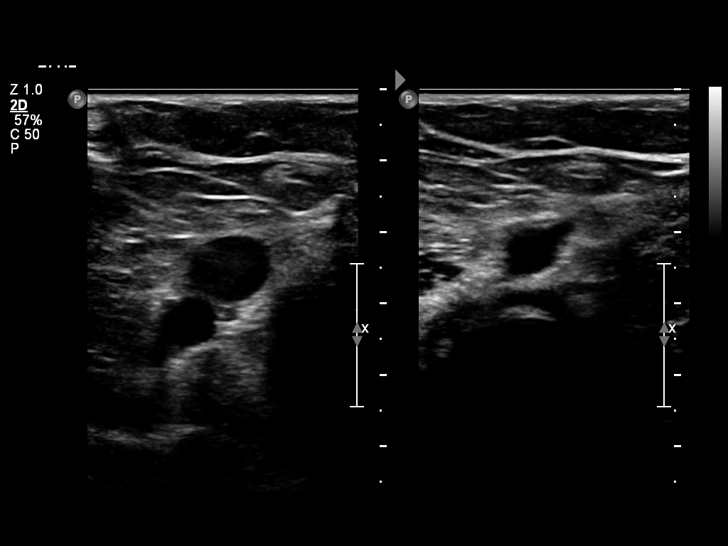
[im 21/40]
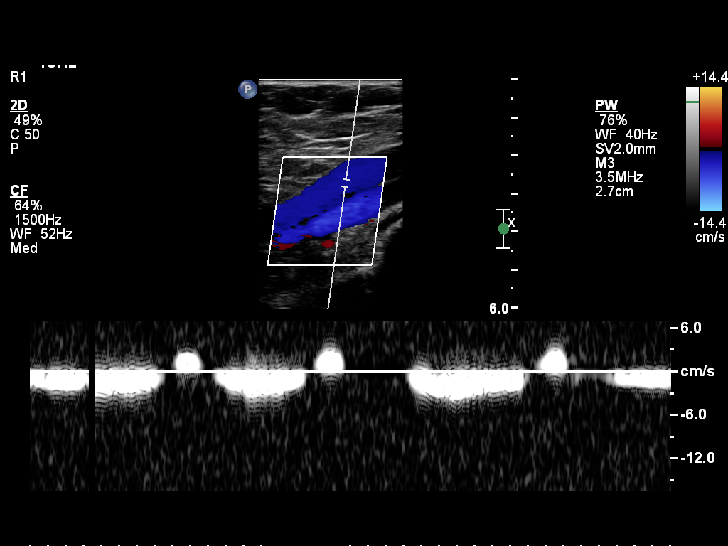
[im 24/40]
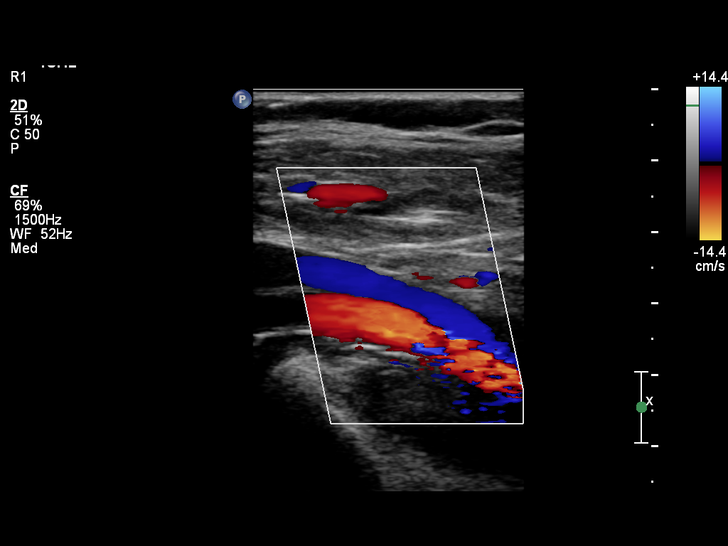
[im 27/40]
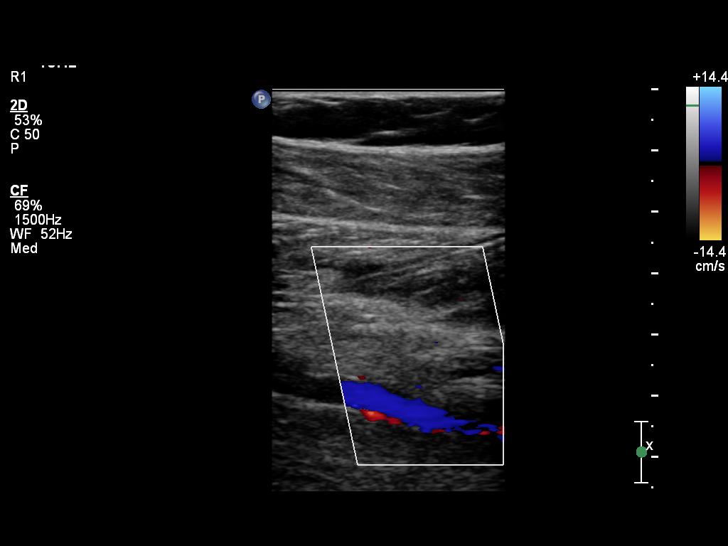
[im 32/40]
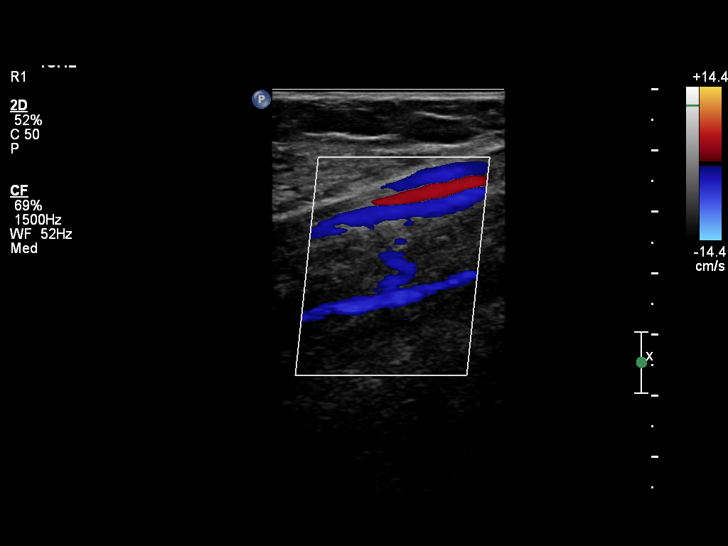
[im 34/40]
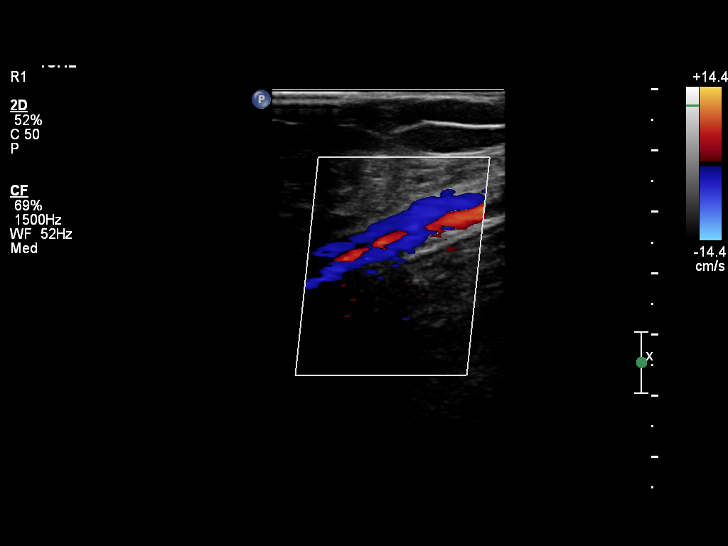
[im 37/40]
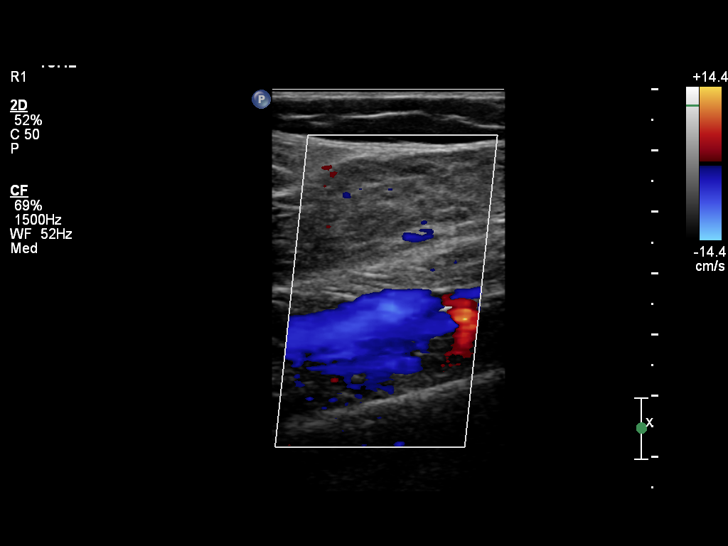
[im 40/40]
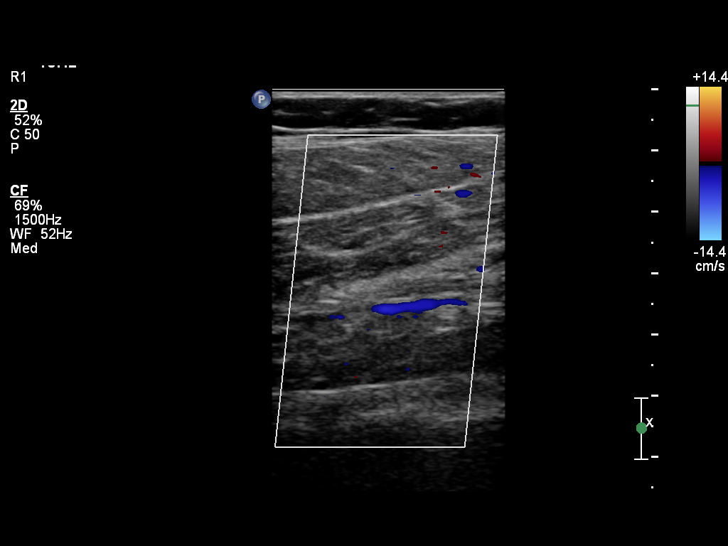

[14 of 16 positions shown; findings below may reference images not displayed]

The venous system of the left lower extremity is normally compressible from the common femoral vein to the popliteal vein. Normal Doppler venous waveform patterns were obtained with normal response to augmentation.
IMPRESSION: No evidence of DVT.
Is the patient pregnant?
No

## 2021-09-15 IMAGING — CT CT ABDOMEN PELVIS WITH CONTRAST
2 of 3 series · 16 of 46 positions shown, 18 images · IV contrast (agent unspecified)
Comparison: CT abdomen and pelvis are [id number]
Procedure: 077cc Isovue 370  IV for routine protocol
All CT scans at this facility use iterative reconstruction, dose modulation and/or weight based dosing when appropriate to reduce radiation dose to as low as reasonably achievable.

FINAL REPORT:
CT Abdomen and Pelvis  with contrast
CLINICAL DATA: Abdominal pain, acute, nonlocalized

[Series 2: abd/pel · axial · 0.76mm/px · z∈[-434,-62]mm · 13 of 173 slices shown, 15 images]
[im 12/173  soft-tissue]
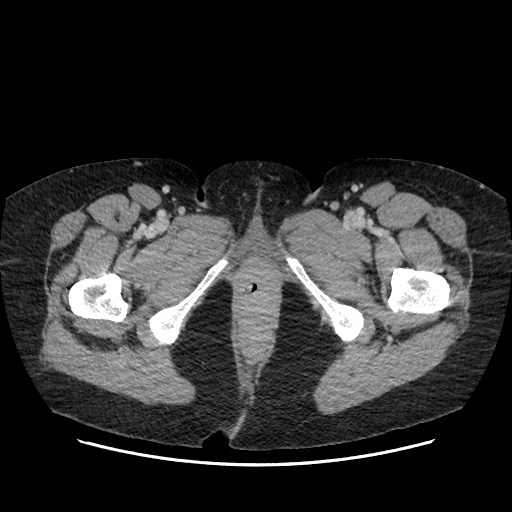
[im 12/173  bone]
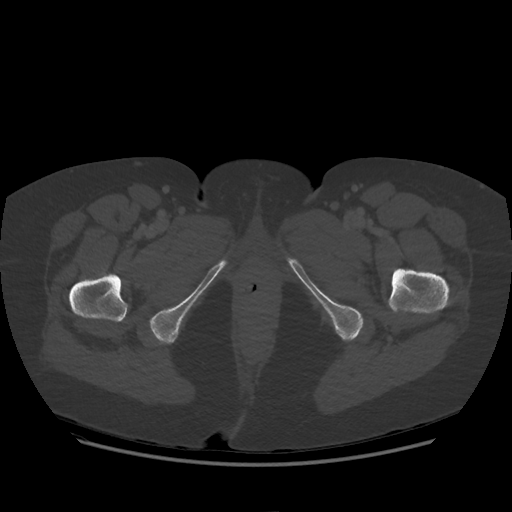
[im 23/173  soft-tissue]
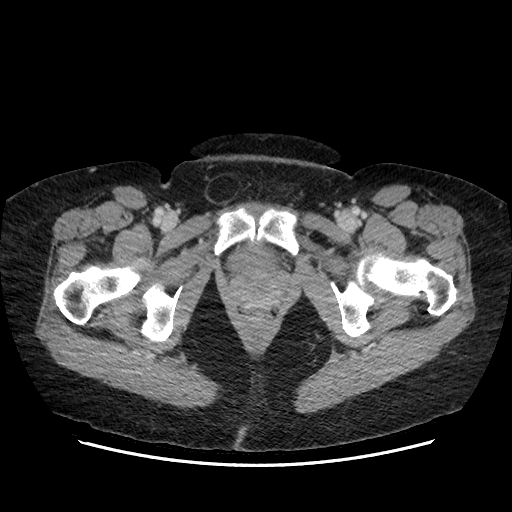
[im 34/173  soft-tissue]
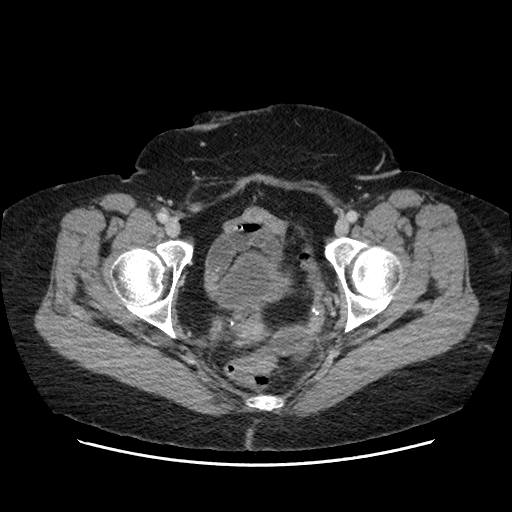
[im 50/173  soft-tissue]
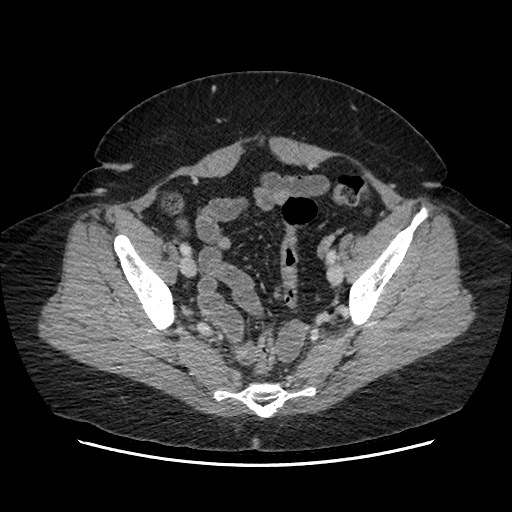
[im 62/173  soft-tissue]
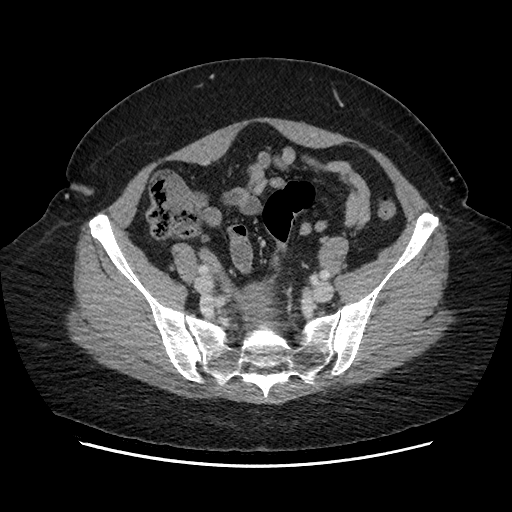
[im 73/173  soft-tissue]
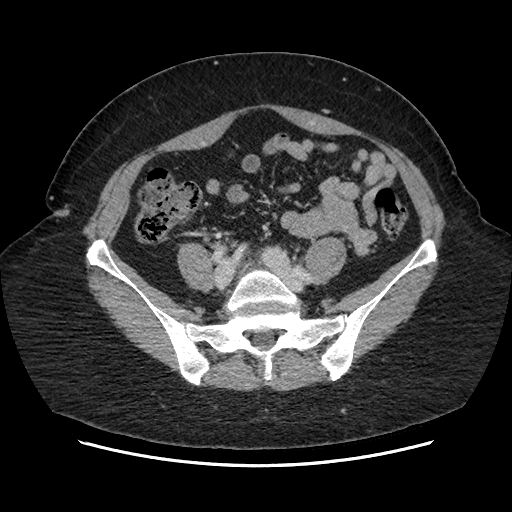
[im 89/173  soft-tissue]
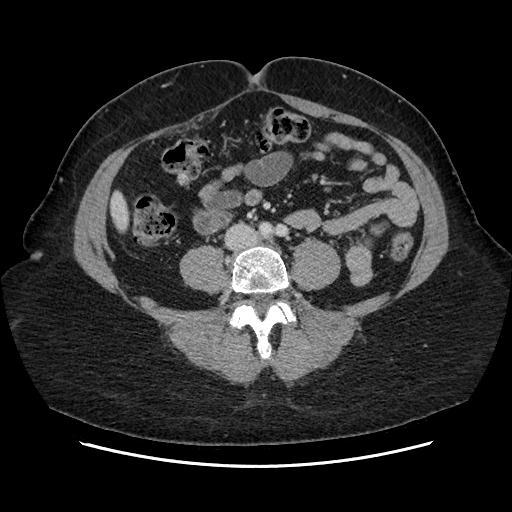
[im 100/173  soft-tissue]
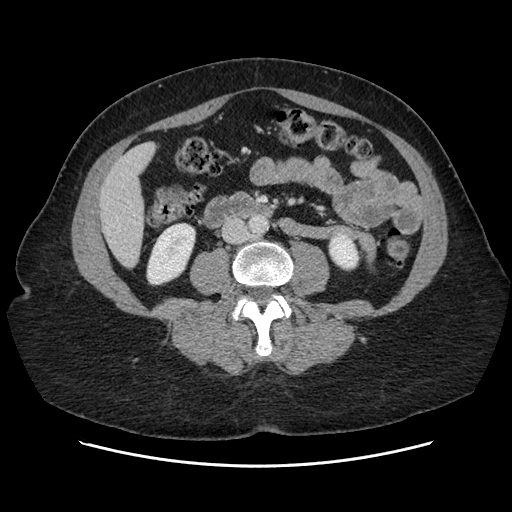
[im 111/173  soft-tissue]
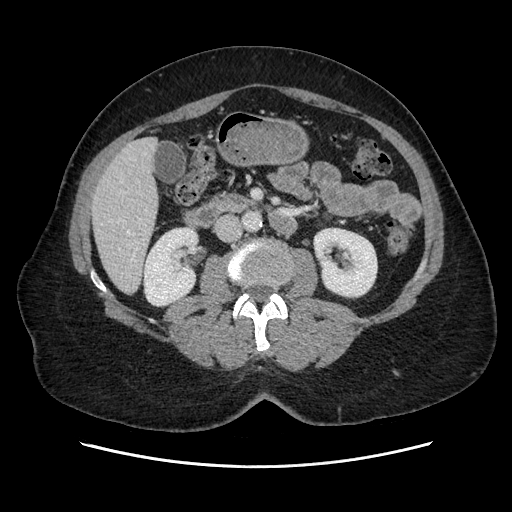
[im 111/173  bone]
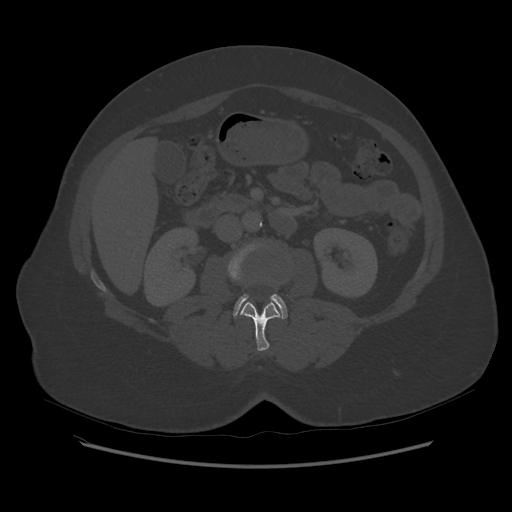
[im 123/173  soft-tissue]
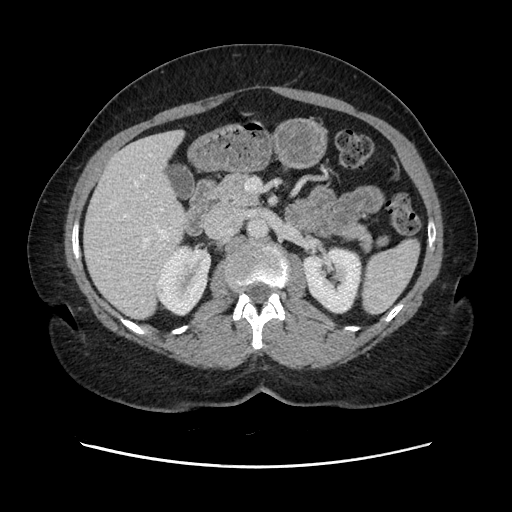
[im 139/173  soft-tissue]
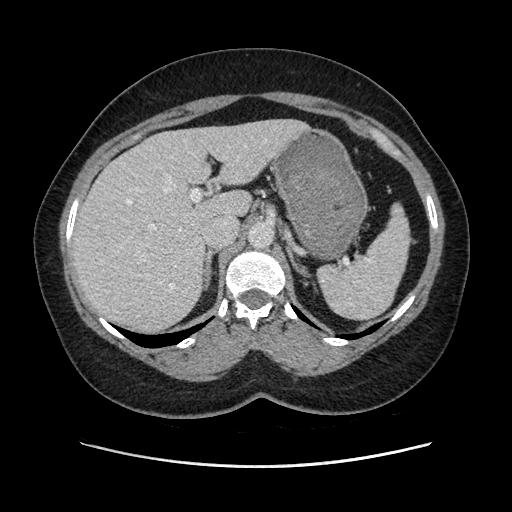
[im 150/173  soft-tissue]
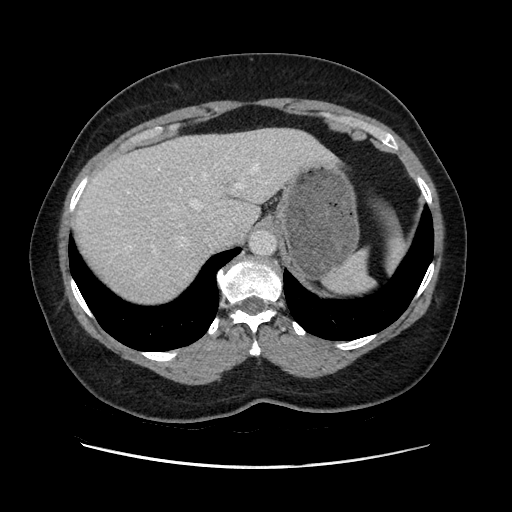
[im 161/173  soft-tissue]
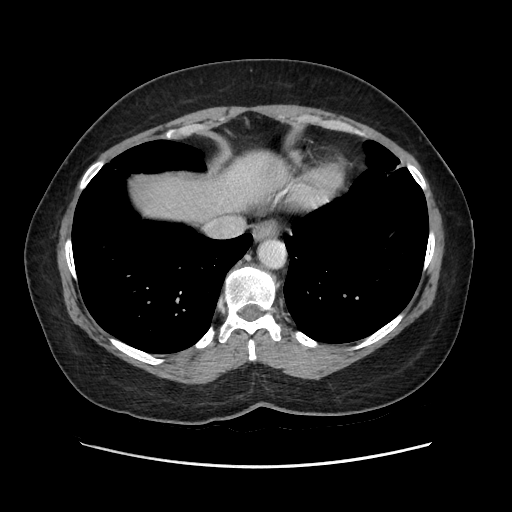

[Series 602: sag standard 2x2 · sagittal · 0.84mm/px · 3 of 196 slices shown]
[im 66/196  soft-tissue]
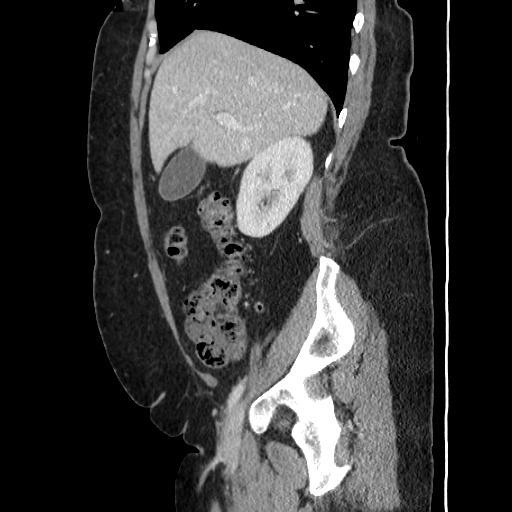
[im 87/196  soft-tissue]
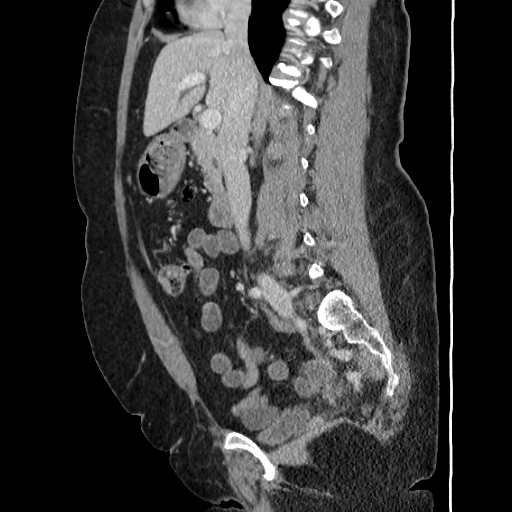
[im 109/196  soft-tissue]
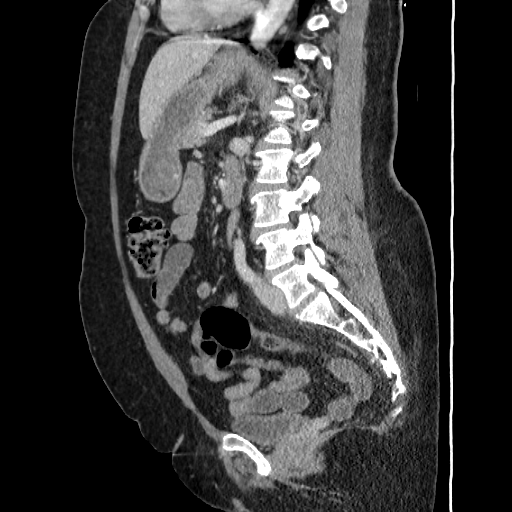

[16 of 46 positions shown; findings below may reference images not displayed]

Findings Abdomen:
Stable minimal linear scarring at lung bases.
In the left hepatic lobe, anterior subcapsular or millimeter hypodense lesion has stability. Statistically, most likely small cyst or hemangioma. Requires no targeted imaging follow-up per ACR guidelines.
Lower pole right kidney, 3 mm round hypodense lesions towards renal cyst, requires no targeted imaging follow-up per ACR guidelines.
Previous pancreatic 1.6 cm pancreatic tail lesion shown 12/29/2018 is not persistent.
Mild common duct dilatation at 7.4 mm. Gallbladder present, unremarkable.
Distal common bile duct
Distal pancreatic duct 6 mm. Pancreatic head unremarkable. No surrounding inflammatory change. No CT evidence of choledocholithiasis.
Solid organ enhancement is otherwise unremarkable.
Mild calcified aortic plaque. No abdominal aneurysm.
Unopacified bowel lacks transition zone and upper abdomen. Pancolonic diverticulosis.
No upper abdominal inflammatory change or free fluid
FINDINGS PELVIS:
Unopacified pelvic bowel extra transition zone. Sigmoid diverticulosis. Air refluxes in nondilated appendix.
Bladder unremarkable.
No pelvic free fluid or inflammatory change.
Small right inguinal hernia contains fat only.
Bone windows accentuate spondylosis.
IMPRESSION: 
IMPRESSION: 1.
1. Persistent mild dilatation distal pancreatic duct and common bile duct. Etiology indeterminate.
Recommend nonemergent MRCP follow-up.
2. Previous 1.6 cm cystic lesion pancreatic tail has resolved since [DATE].Pancolonic diverticulosis.
4 Small right inguinal hernia, containing fat only.
5. Calcified aortic plaque without abdominal aneurysm.1
Comment:
Unless the patient's specific circumstances suggest otherwise, any liver lesion 0.5 cm or less, any cystic kidney lesion less than 1.0 cm, and/or any adrenal lesion 1.0 cm or less not otherwise characterized in this report as possessing suspicious or indeterminate imaging features is/are most likely to be benign and do not require follow-up imaging or biopsy
Is the patient pregnant?
No

## 2021-10-01 IMAGING — US US PELVIS TRANSVAGINAL
1 series · 14 of 25 positions shown · non-contrast
Comparison: None
Multiple transabdominal and transvaginal grayscale and Doppler images were provided.

FINAL REPORT:
Pelvic ultrasound:
CLINICAL INDICATION: Pelvic and perineal pain

[Series 1: us pelvis transvaginal · 14 of 63 slices shown]
[im 1/63]
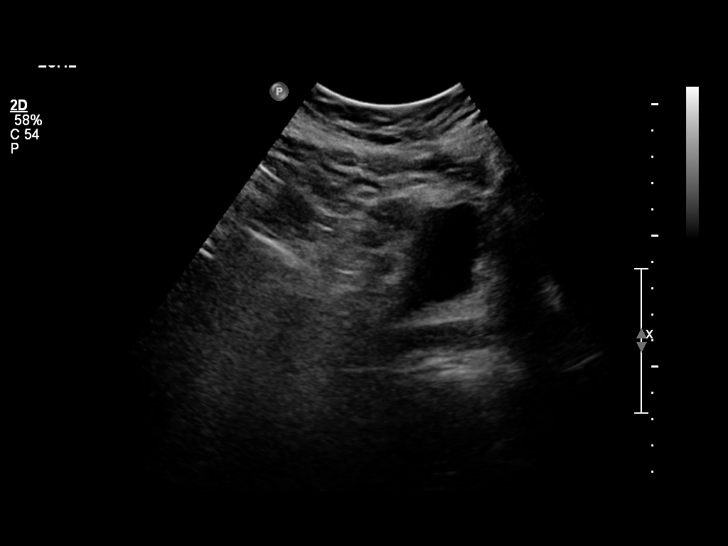
[im 6/63]
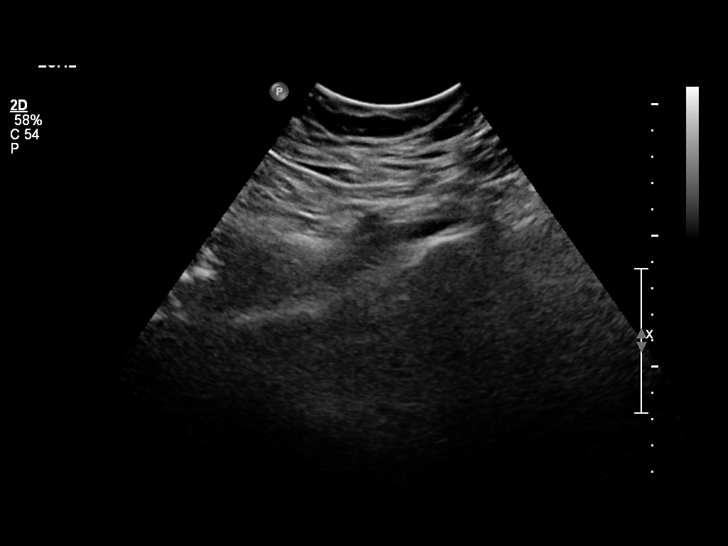
[im 11/63]
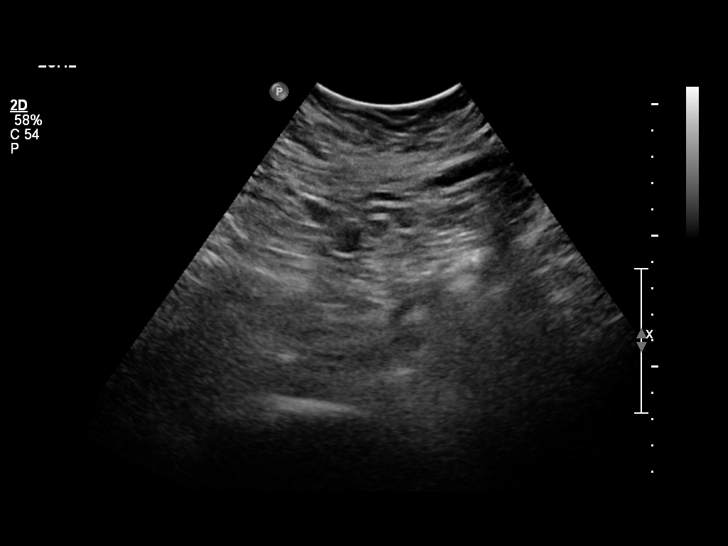
[im 16/63]
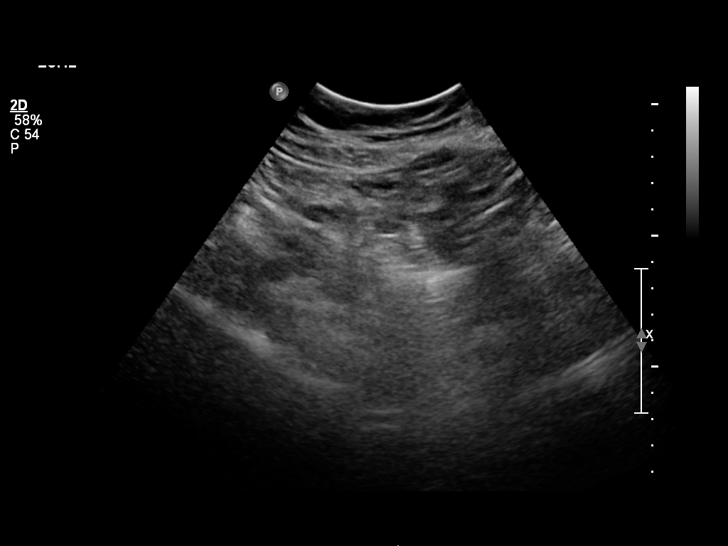
[im 21/63]
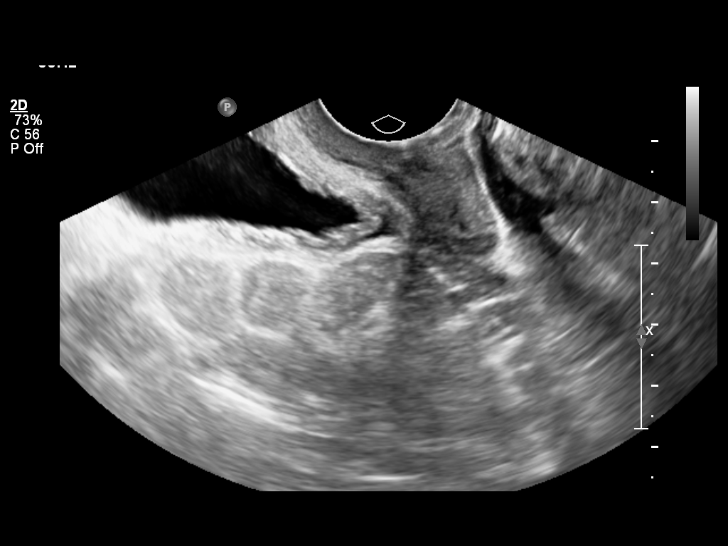
[im 24/63]
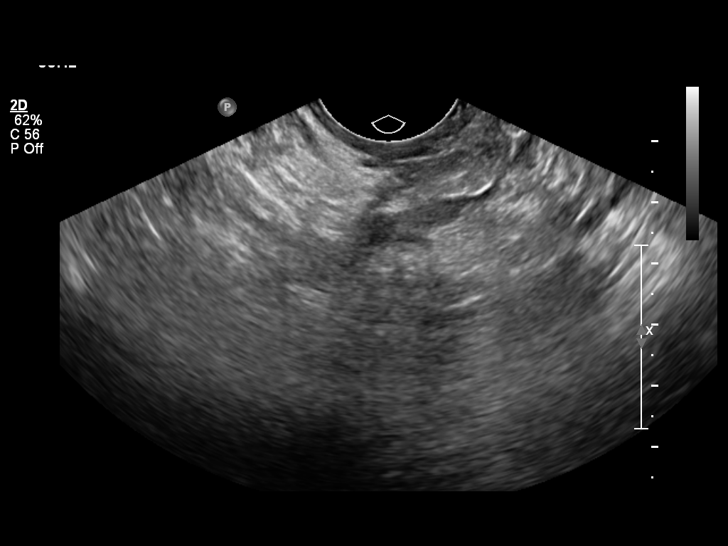
[im 29/63]
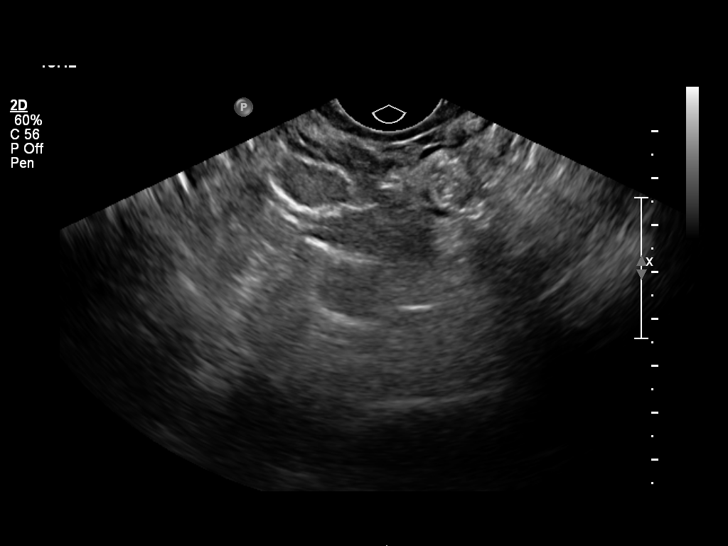
[im 34/63]
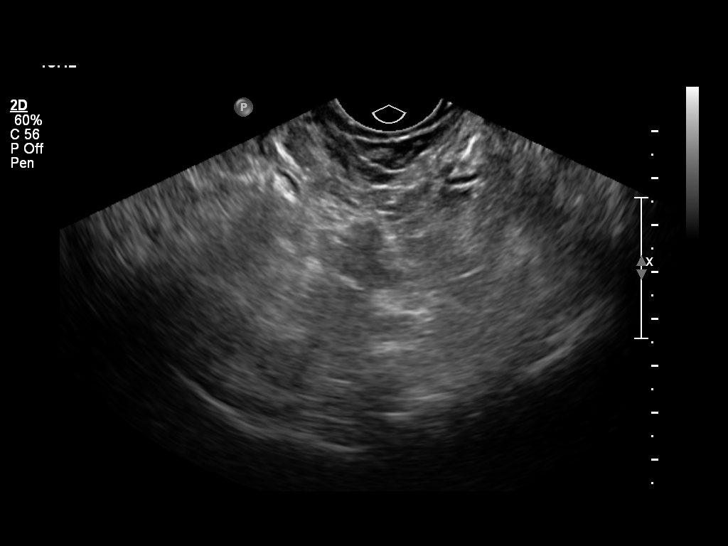
[im 39/63]
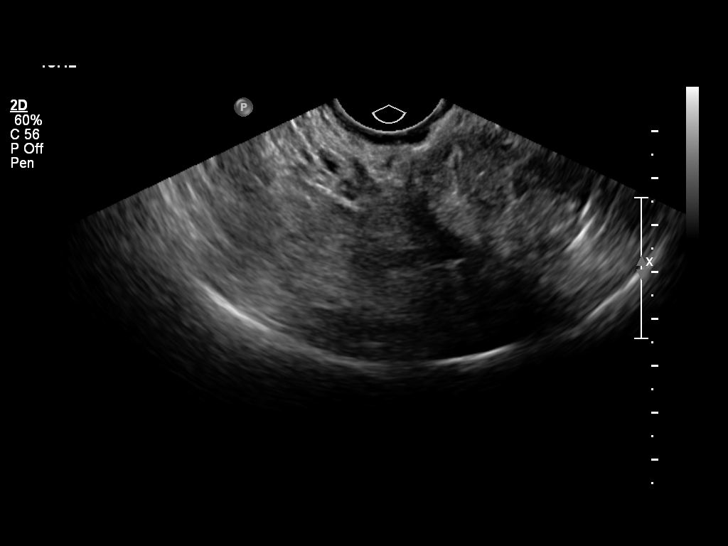
[im 42/63]
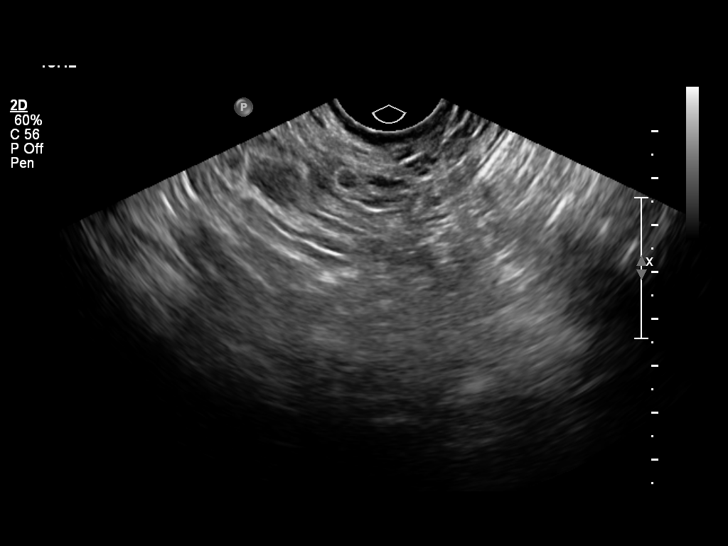
[im 47/63]
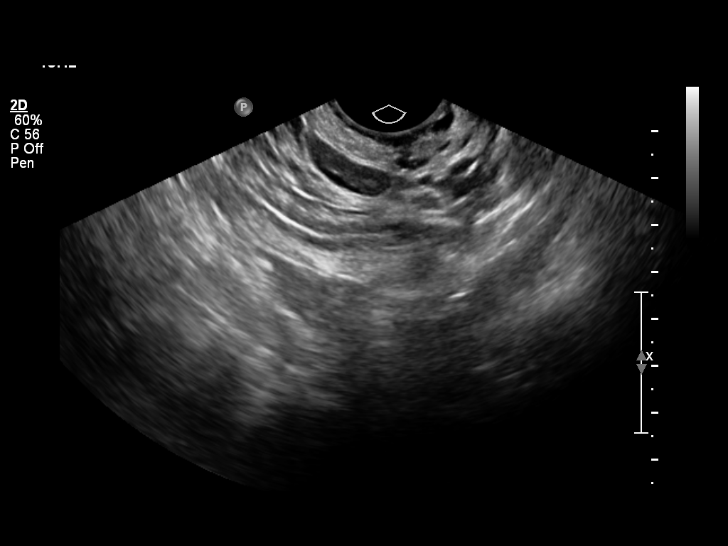
[im 52/63]
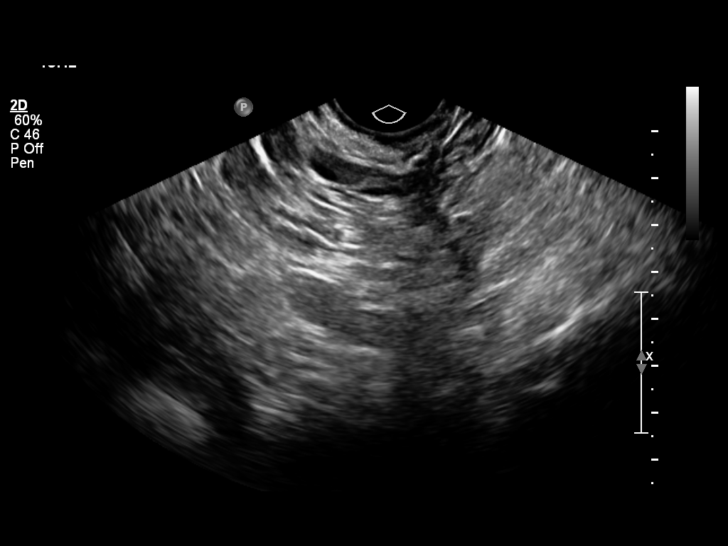
[im 57/63]
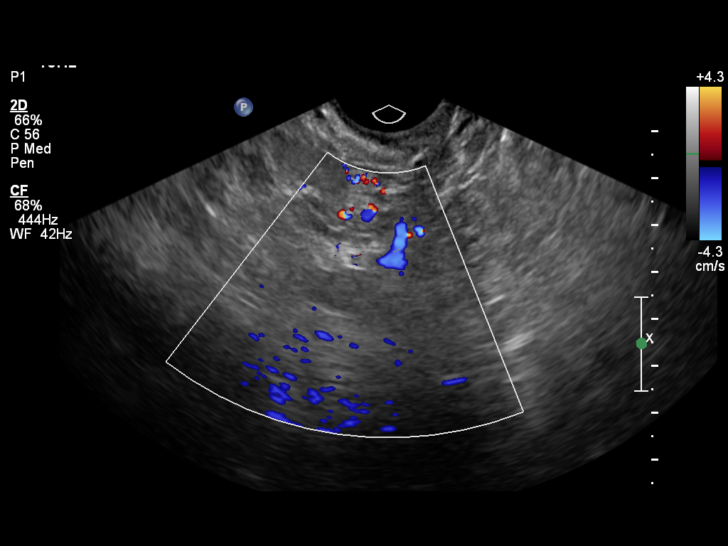
[im 63/63]
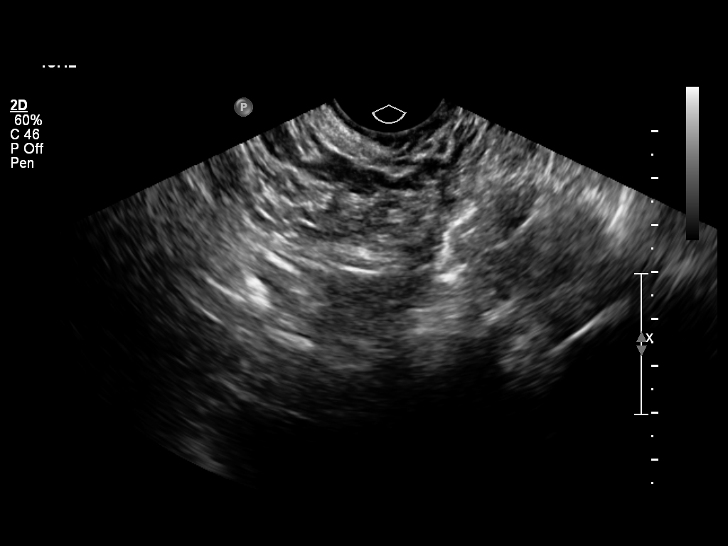

[14 of 25 positions shown; findings below may reference images not displayed]

The uterus is surgically absent by history.
Transabdominal imaging shows no pelvic mass or free fluid. The ovaries are not seen.
Transvaginal imaging shows no evidence of pelvic free fluid or mass. The ovaries are not seen.
IMPRESSION: Hysterectomy.
Nonvisualization of the ovaries could be due to bowel gas or surgical removal.
No pelvic mass or free fluid.

## 2021-10-20 IMAGING — MG MAMMO BREAST SCREENING BILATERAL
8 of 19 series · 8 of 40 positions shown · non-contrast
Comparison: [DATE]

This is a summary report. The complete report is available in the patient's medical record. If you cannot access the medical record, please contact the sending organization for a detailed fax or copy.
FINAL REPORT:
EXAM: MAMMO BREAST SCREENING TOMOSYNTHESIS BILATERAL
CLINICAL HISTORY: Screening mammogram. No complaints.
TECHNIQUE: MLO and CC views of both breasts with tomosynthesis were obtained on a digital full-field mammographic unit. This examination was interpreted in conjunction with computer aided detection system.

[R XCCL]
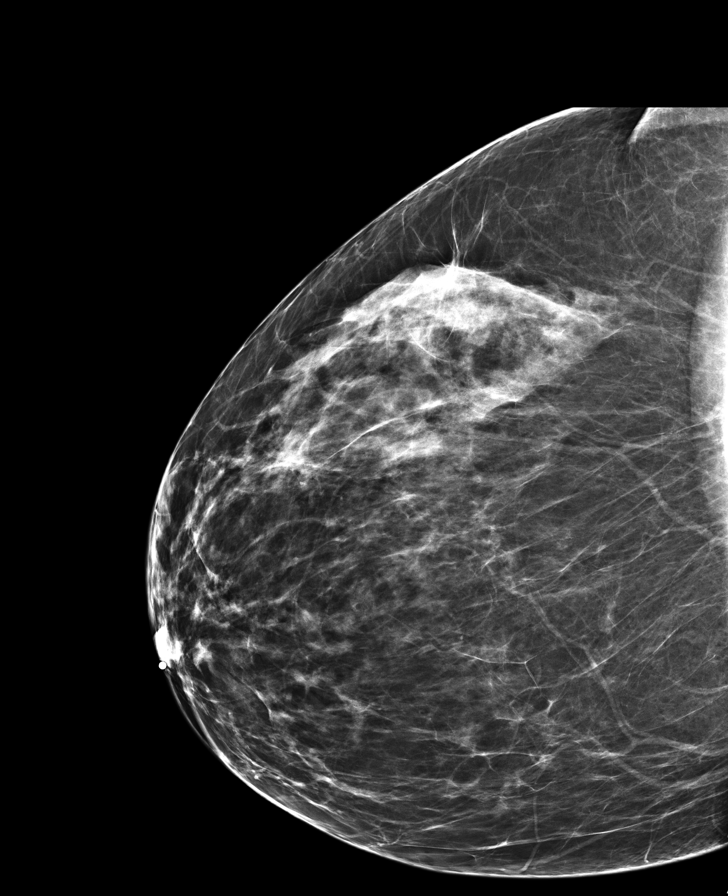

[L XCCL]
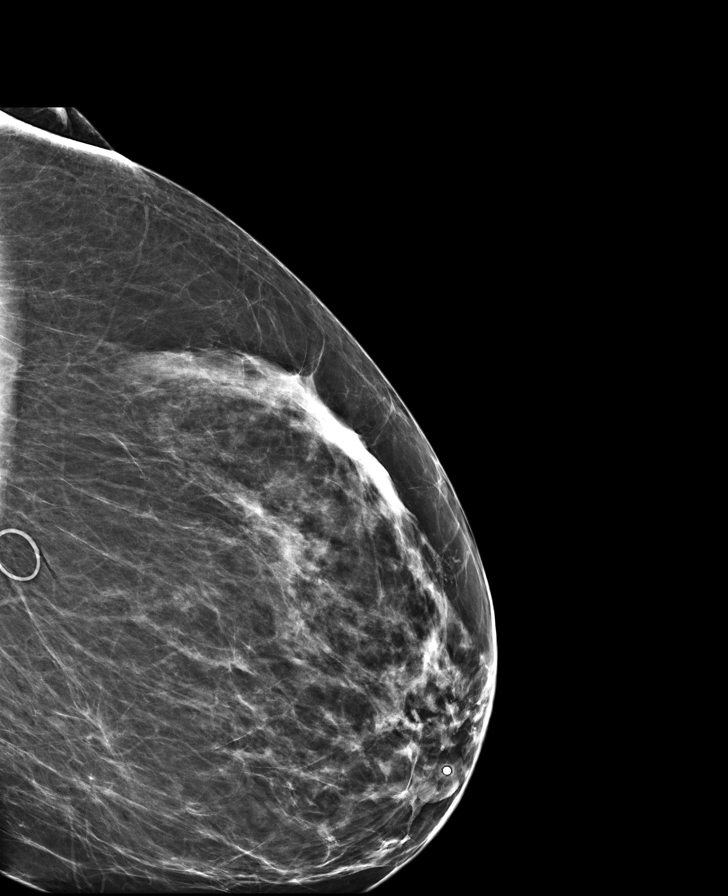

[L CC]
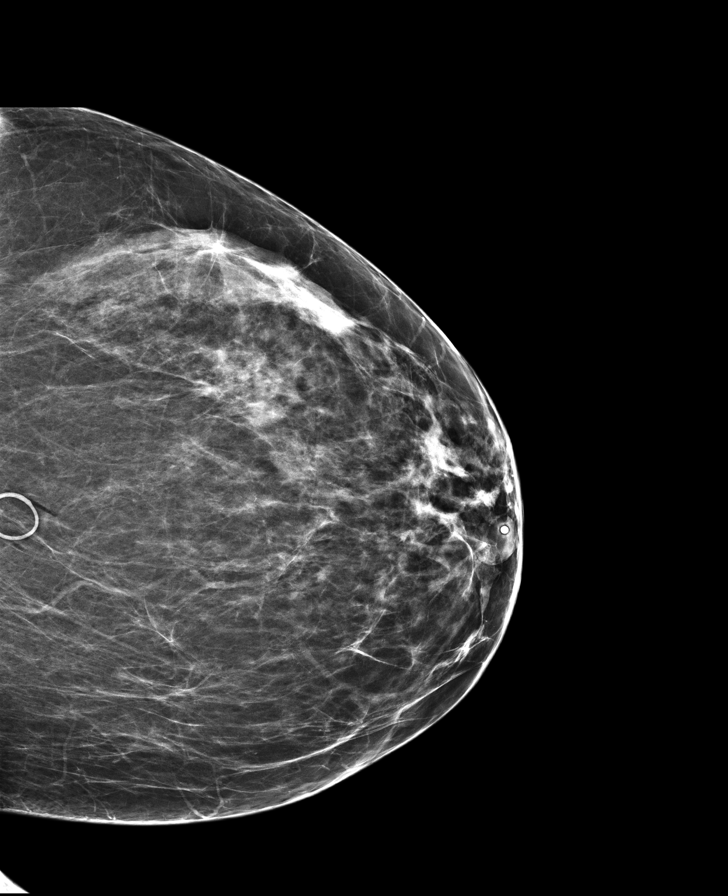

[L MLO]
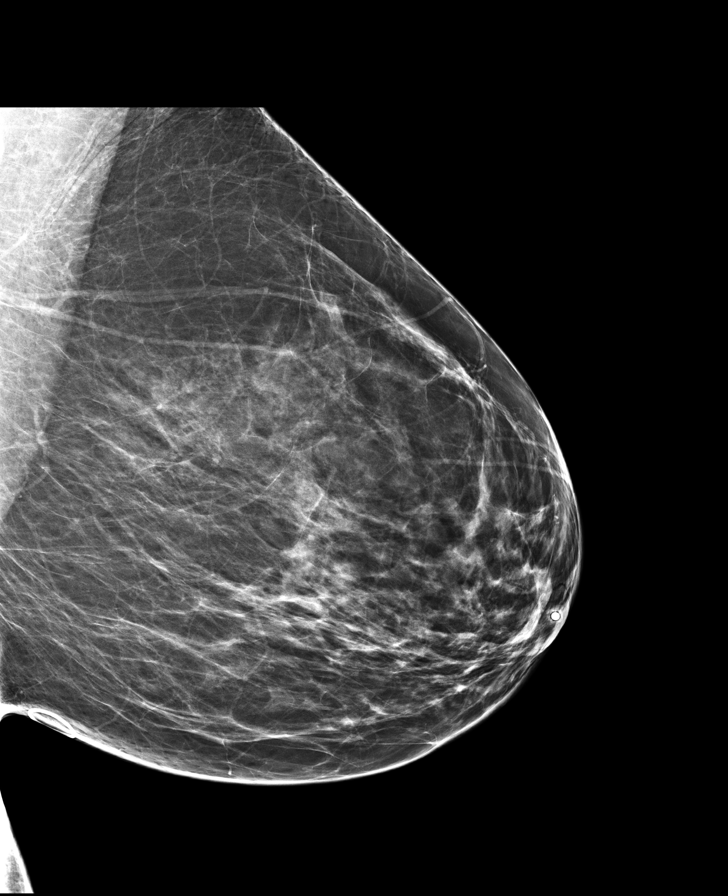

[L MLO synth-2D]
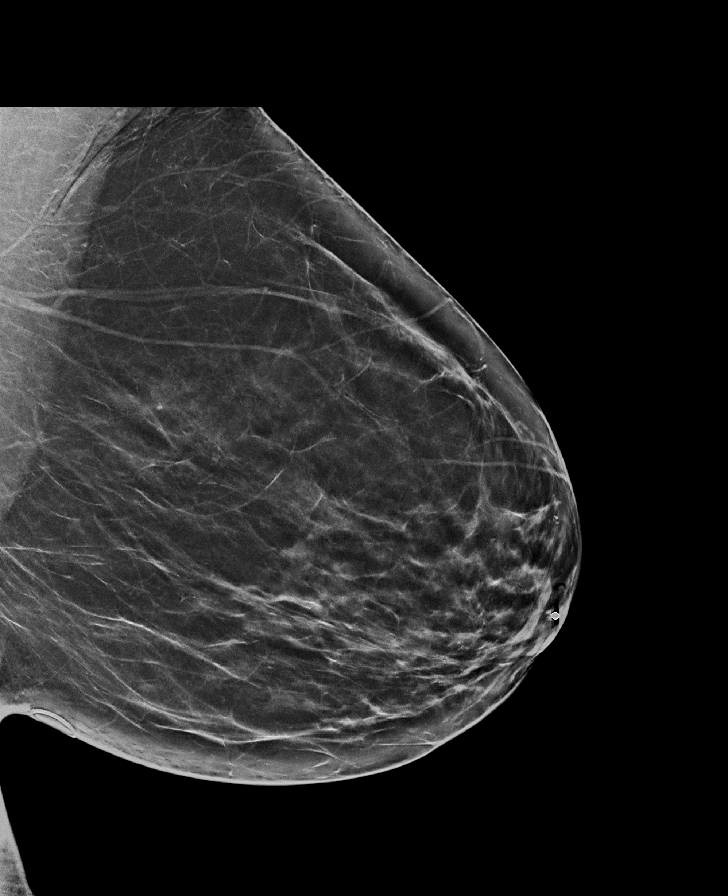

[R MLO synth-2D]
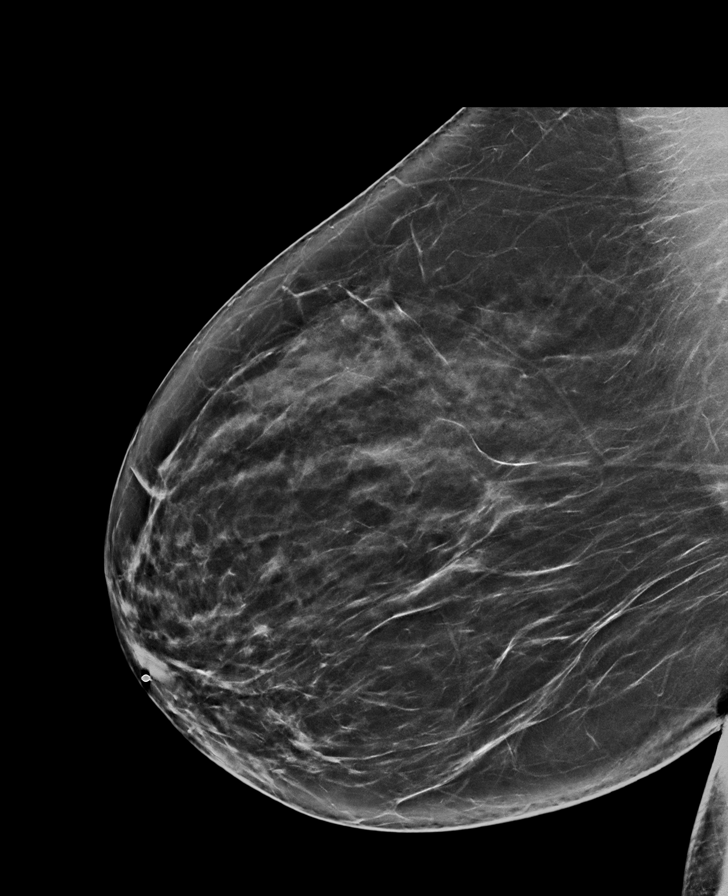

[R CC]
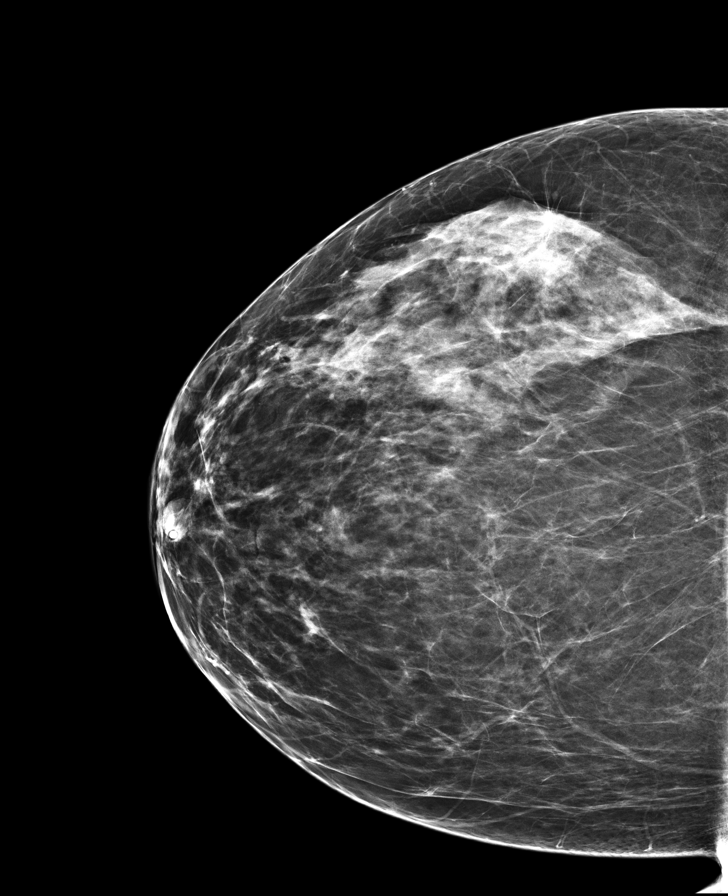

[L CC synth-2D]
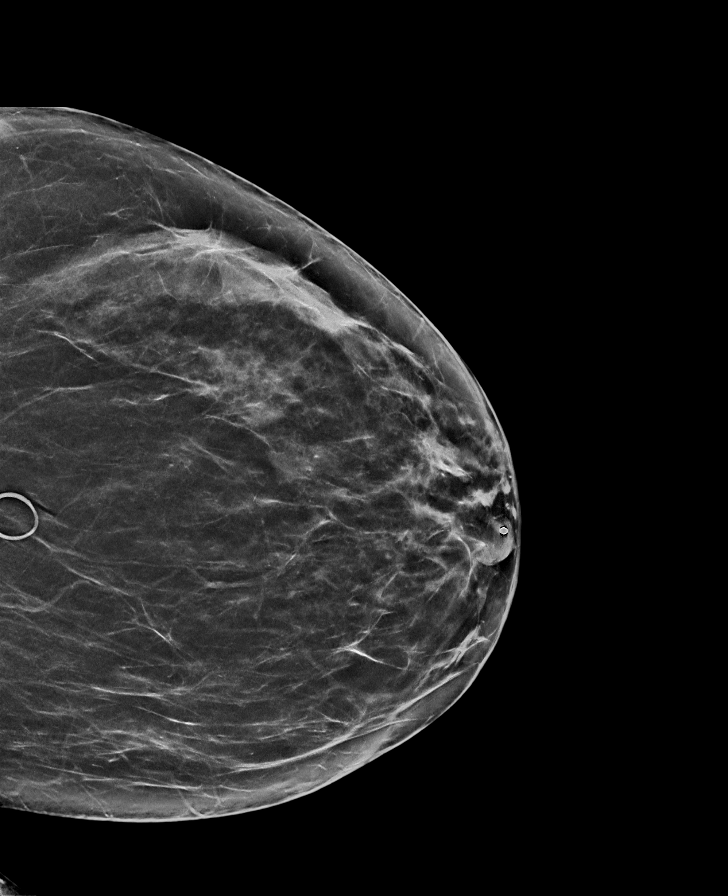

[8 of 40 positions shown; findings below may reference images not displayed]

FINDINGS: BREAST DENSITY TYPE B: There are scattered areas of fibroglandular density.
When compared to the prior exam, there has been no significant interval change. The skin, nipples, and both axillae are unremarkable. There is no evidence of a dominant mass, suspicious cluster of calcifications, or architectural distortion in either breast.
IMPRESSION: No mammographic evidence of malignancy.
BI-RADS Category 2: Benign
Recommendation(s): Bilateral screening mammogram in one year.
The patient information will be entered into a reminder system with a target due date for the next mammogram.
Is the patient pregnant?
No

## 2021-11-12 IMAGING — MR MRI ABDOMEN WITHOUT CONTRAST
5 of 7 series · 34 of 48 positions shown · non-contrast
Comparison: CT abdomen and pelvis September 15, 2021

FINAL REPORT:
MRI abdomen without contrast MRCP
INDICATION: Other specified diseases of biliary tract
TECHNIQUE: Multisequence multiplanar images were obtained of the abdomen without IV contrast. 3-D thick slab MRCP images obtained.

[Series 1: survey inspiration · axial · 8.0mm · 0.98mm/px · z∈[-96,+250]mm · 4 of 24 slices shown]
[im 1/24]
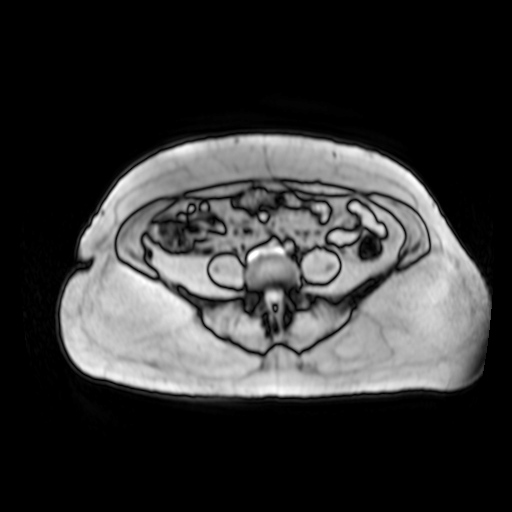
[im 8/24]
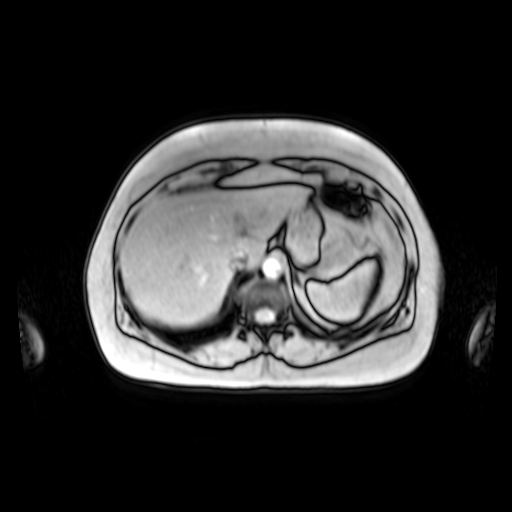
[im 16/24]
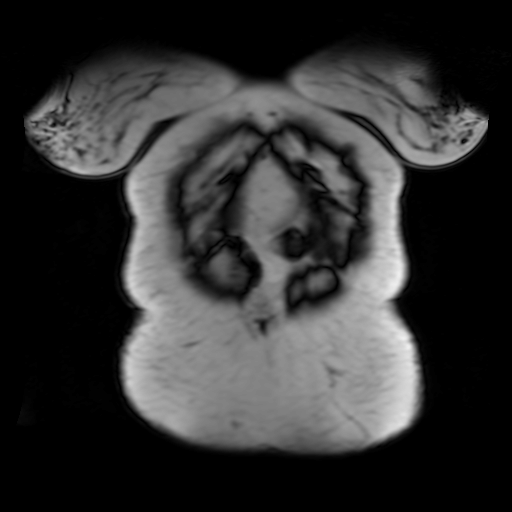
[im 24/24]
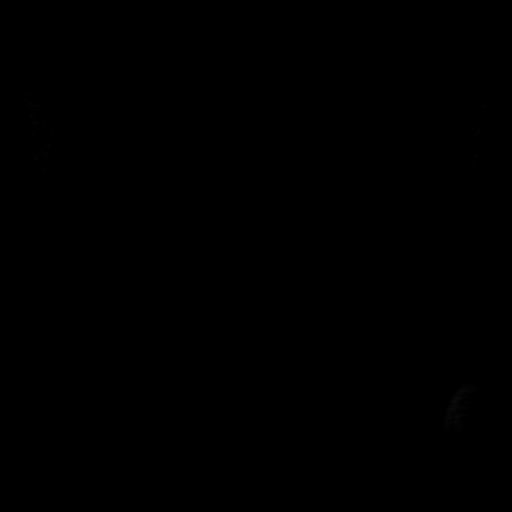

[Series 2: T2 · axial · 6.0mm · 1.45mm/px · z∈[-98,+110]mm · 5 of 30 slices shown (1 of 2)]
[im 1/30]
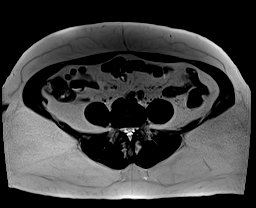
[im 8/30]
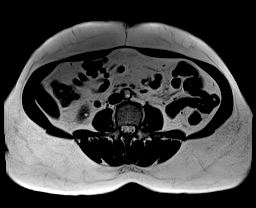
[im 15/30]
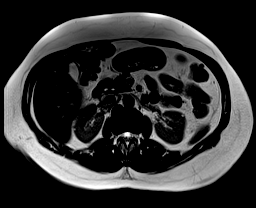
[im 22/30]
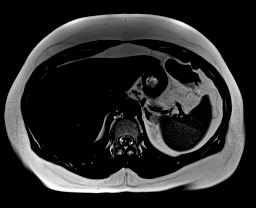
[im 30/30]
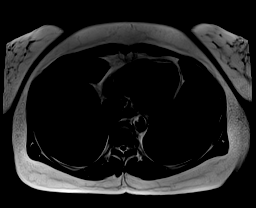

[Series 4: T2 · coronal · 6.0mm · 1.37mm/px · 5 of 26 slices shown (2 of 2)]
[im 1/26]
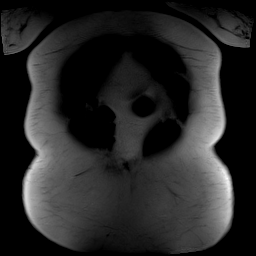
[im 7/26]
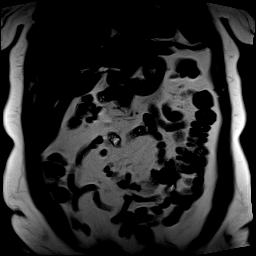
[im 13/26]
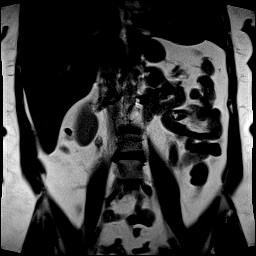
[im 19/26]
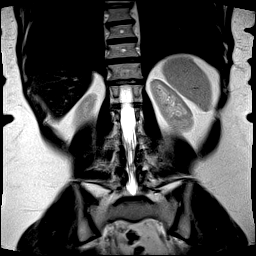
[im 26/26]
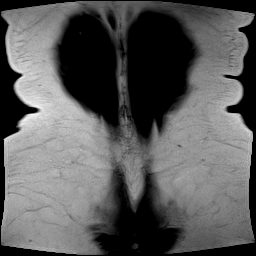

[Series 5: ax dual · axial · 6.0mm · 0.85mm/px · z∈[-146,+135]mm · 14 of 80 slices shown]
[im 1/80]
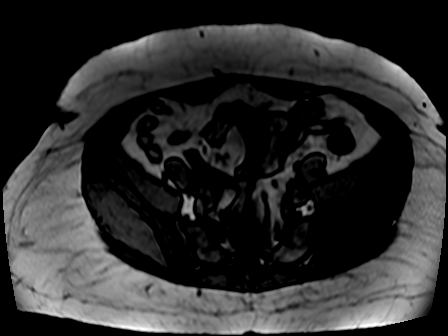
[im 7/80]
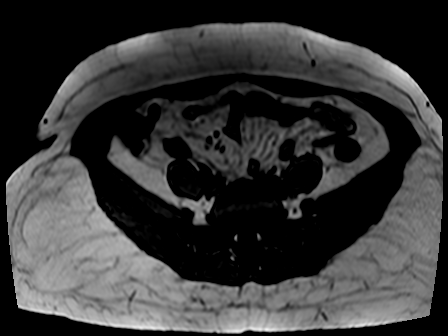
[im 13/80]
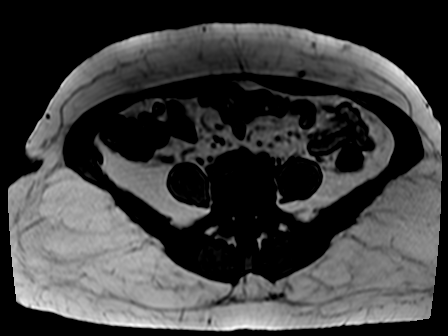
[im 19/80]
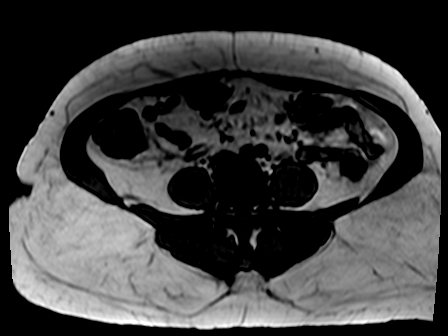
[im 25/80]
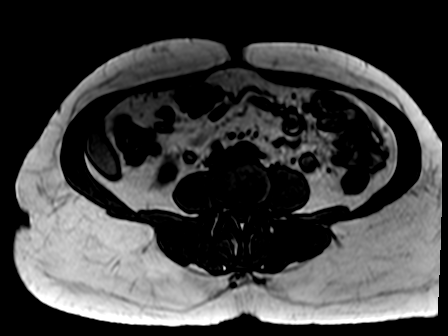
[im 31/80]
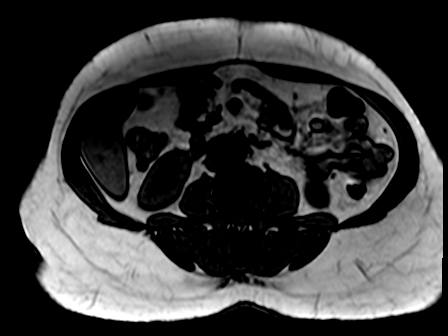
[im 37/80]
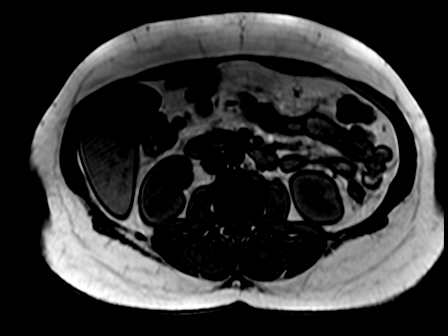
[im 43/80]
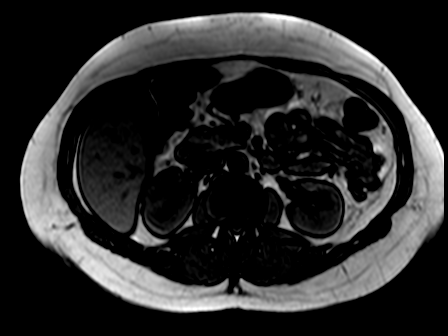
[im 49/80]
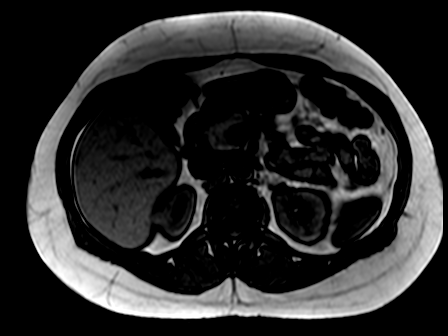
[im 55/80]
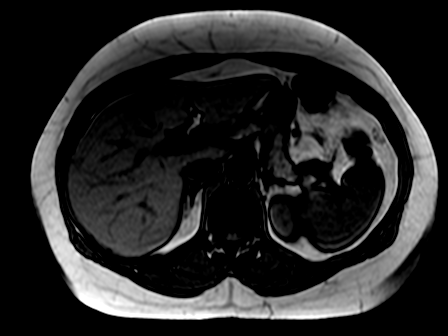
[im 61/80]
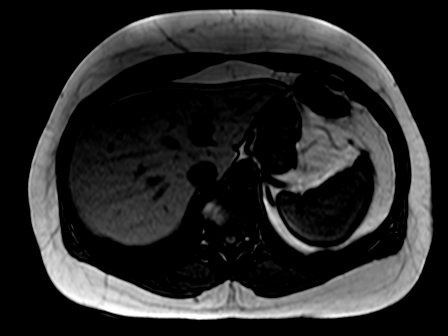
[im 67/80]
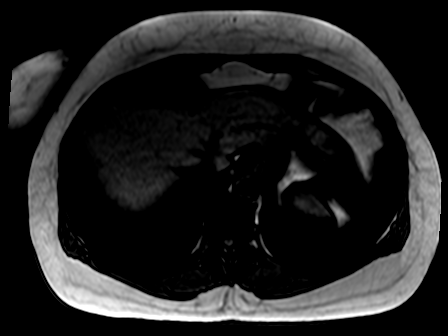
[im 73/80]
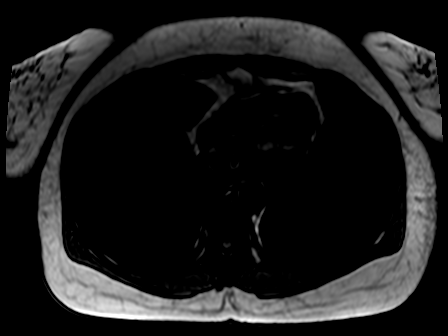
[im 80/80]
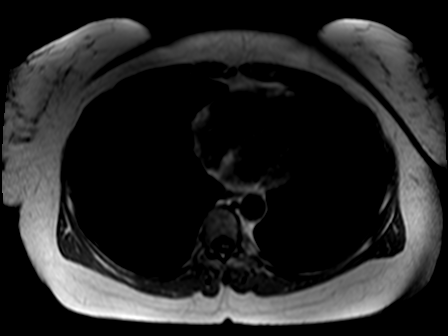

[Series 6: ax btfe · axial · 6.0mm · 1.19mm/px · z∈[-79,+119]mm · 6 of 34 slices shown]
[im 1/34]
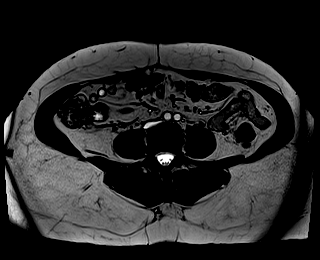
[im 7/34]
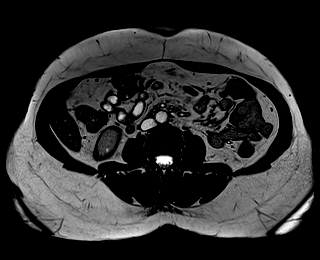
[im 14/34]
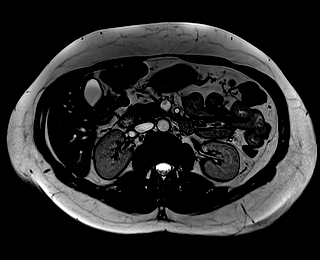
[im 20/34]
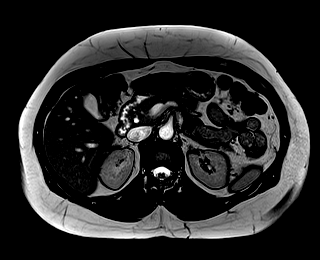
[im 27/34]
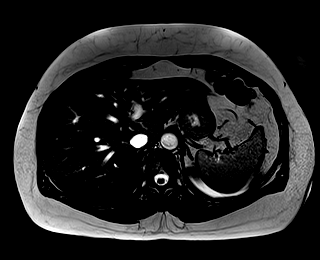
[im 34/34]
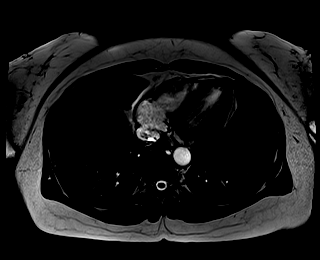

[34 of 48 positions shown; findings below may reference images not displayed]

FINDINGS: Motion degraded study.
Lung bases: Unremarkable.
Liver: 4 mm T2 hyperintense focus in the right lobe of liver, likely representing a cyst.
Gallbladder and biliary tree: The gallbladder is unremarkable. No biliary dilatation. No filling defect is noted in the common bile duct.
Spleen: Unremarkable.
Pancreas: Unremarkable. Borderline prominence of the main pancreatic duct at the pancreatic head measuring up to 4 mm. No pancreatic lesion is identified.
Adrenal glands: Unremarkable.
Kidneys and ureters: No hydronephrosis. Subcentimeter T2 hyperintense foci in both kidneys, likely representing cysts.
Vessels: The abdominal aorta is nonaneurysmal.
Bowel: Unremarkable. Colon diverticulosis.
Bladder: Unremarkable.
Peritoneum: No pathologically enlarged lymph node in the abdomen is identified.
Bones: Unremarkable.
IMPRESSION: 
IMPRESSION: No biliary dilatation and borderline prominence of the main pancreatic duct in the pancreatic head. No filling defect in the biliary system. No pancreatic lesion is identified.
Is the patient pregnant?
No

## 2021-12-19 IMAGING — MR MRI LUMBAR SPINE WITHOUT CONTRAST
4 of 7 series · 27 of 48 positions shown · non-contrast
Comparison: None available

FINAL REPORT:
HISTORY: Lumbar radiculopathy
Procedure: MRI of the lumbar spine without contrast
TECHNIQUE: Multisequence, multiplanar imaging of the lumbar spine was performed.

[Series 6001: T2 · sagittal · 4.0mm · 0.62mm/px · 6 of 15 slices shown (1 of 2)]
[im 1/15]
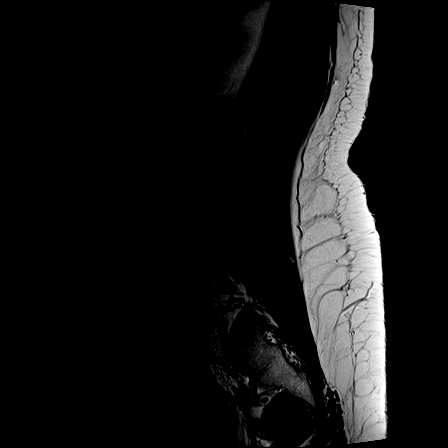
[im 3/15]
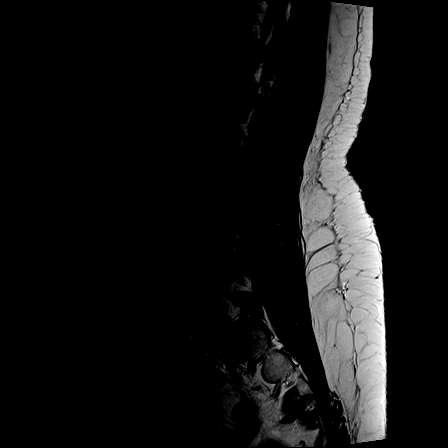
[im 6/15]
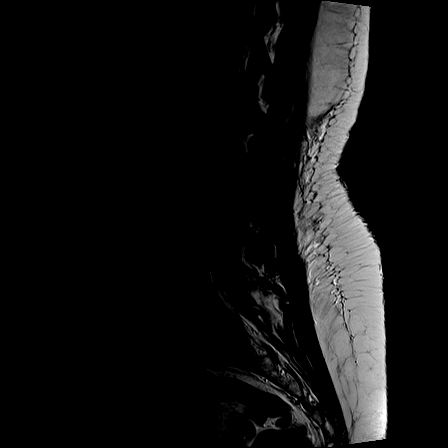
[im 9/15]
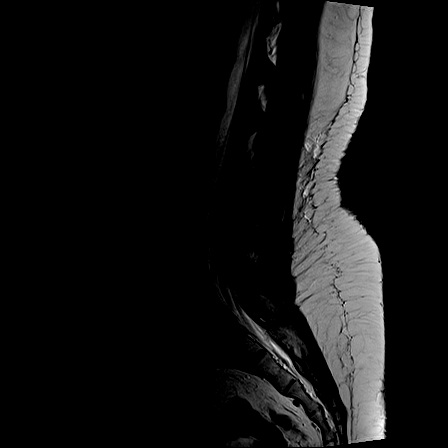
[im 12/15]
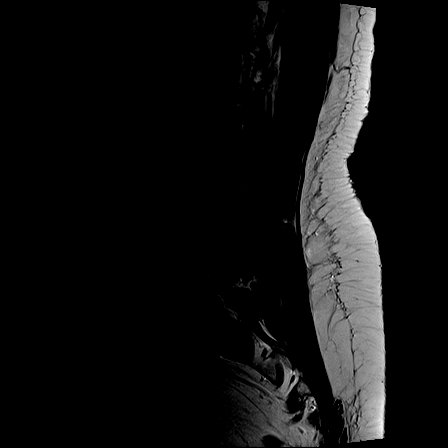
[im 15/15]
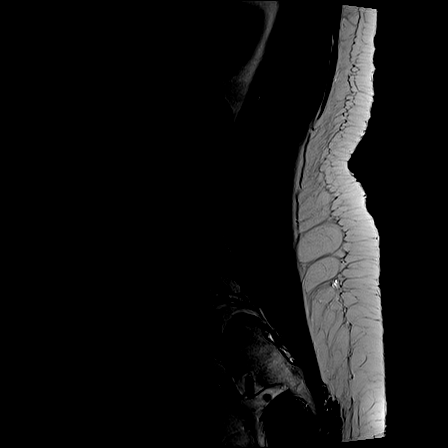

[Series 8001: T1 · sagittal · 4.0mm · 0.62mm/px · 6 of 15 slices shown (1 of 2)]
[im 1/15]
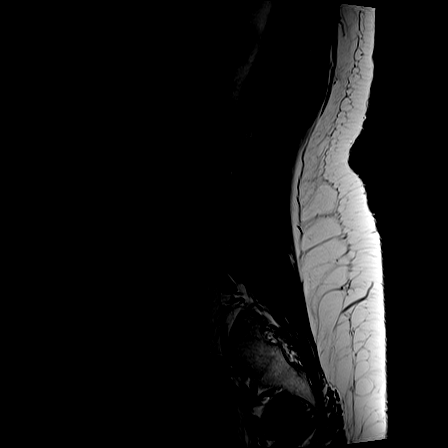
[im 3/15]
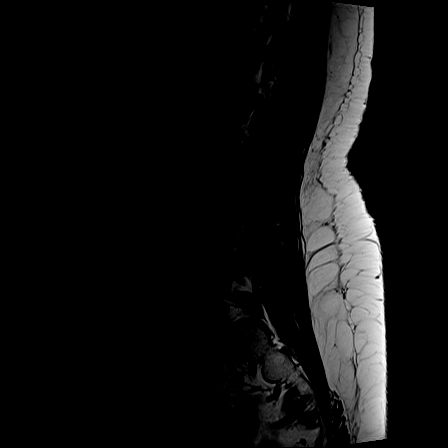
[im 6/15]
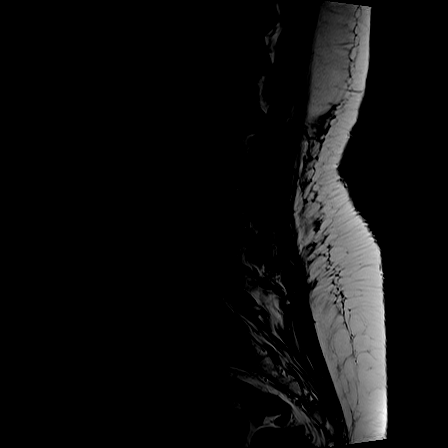
[im 9/15]
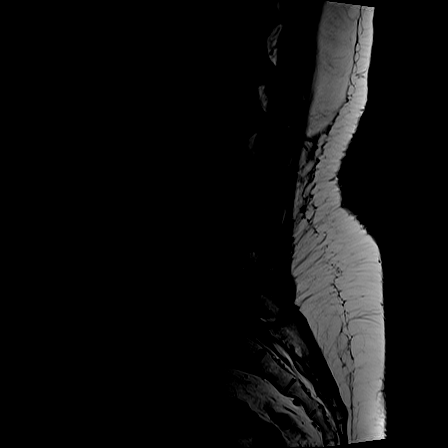
[im 12/15]
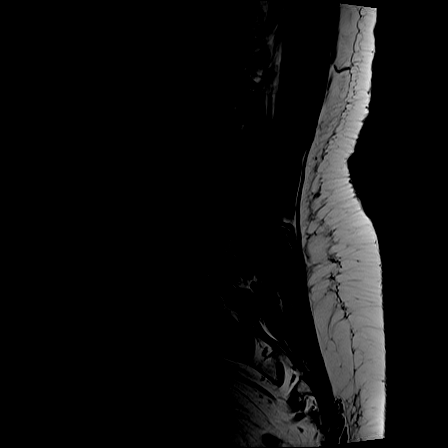
[im 15/15]
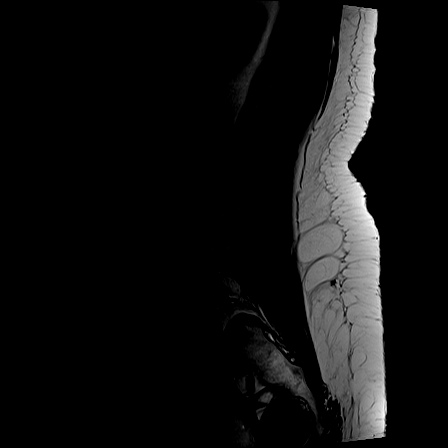

[Series 9001: T2 · oblique · 4.0mm · 0.49mm/px · 9 of 32 slices shown (2 of 2)]
[im 1/32]
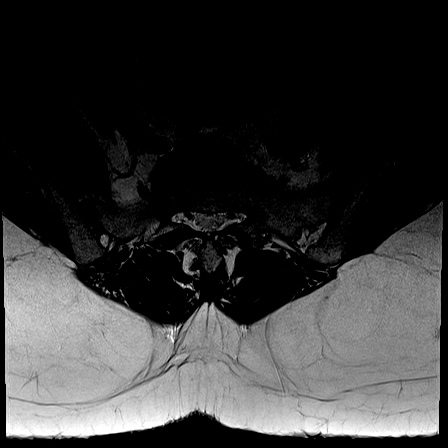
[im 6/32]
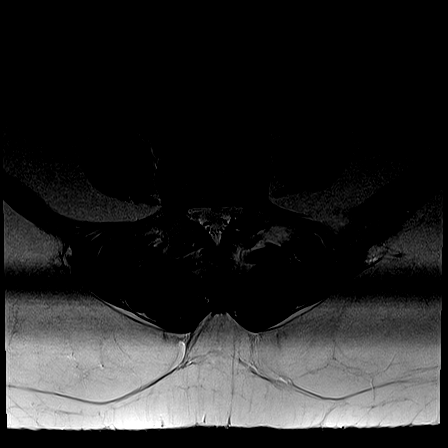
[im 9/32]
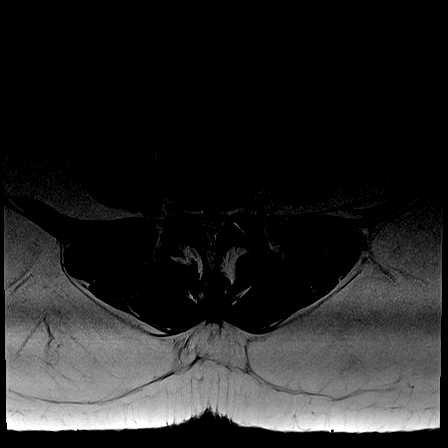
[im 15/32]
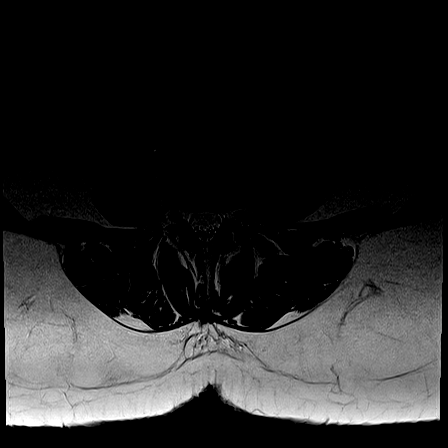
[im 17/32]
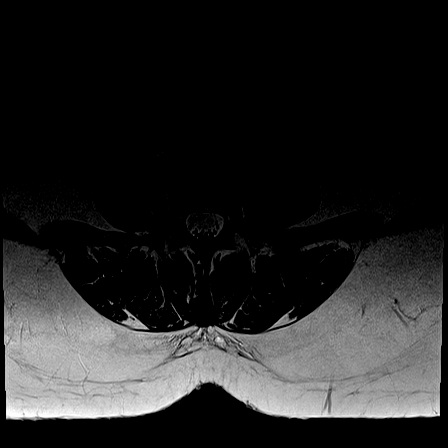
[im 23/32]
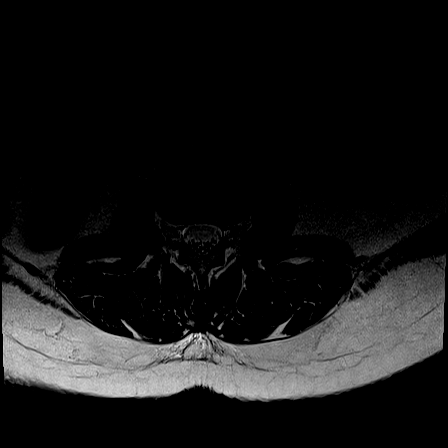
[im 26/32]
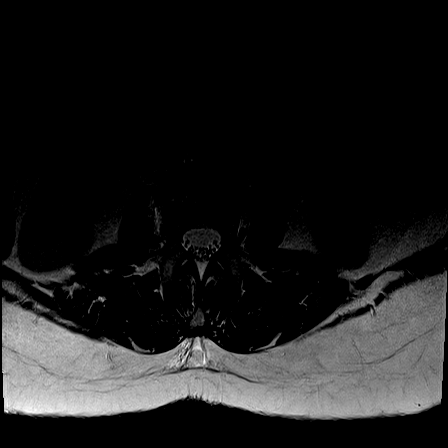
[im 29/32]
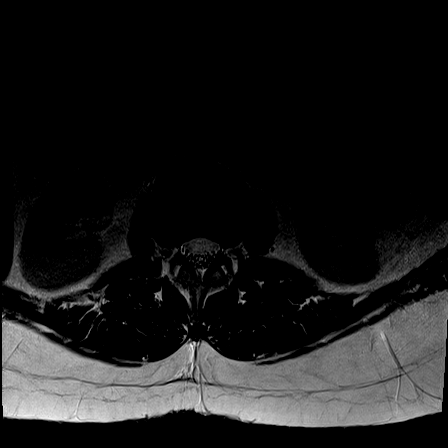
[im 32/32]
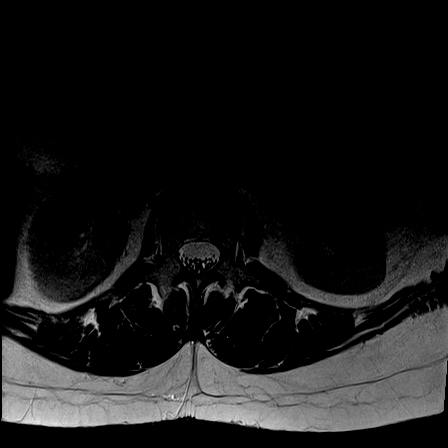

[T1 · oblique · 4.0mm · 0.49mm/px · 6 of 32 slices shown (2 of 2)]
[im 1/32]
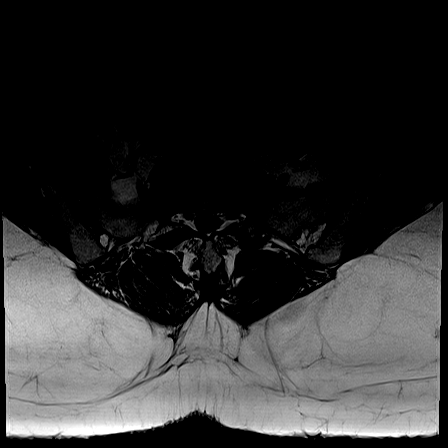
[im 6/32]
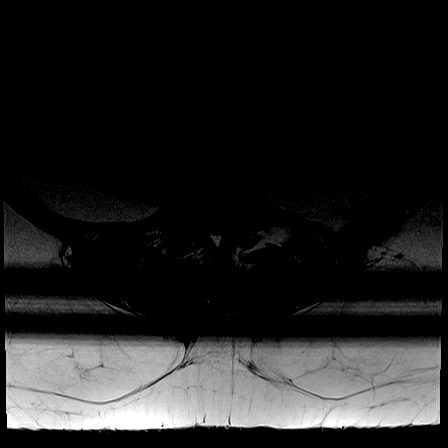
[im 9/32]
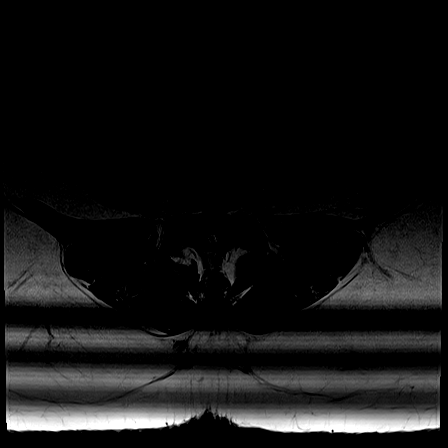
[im 15/32]
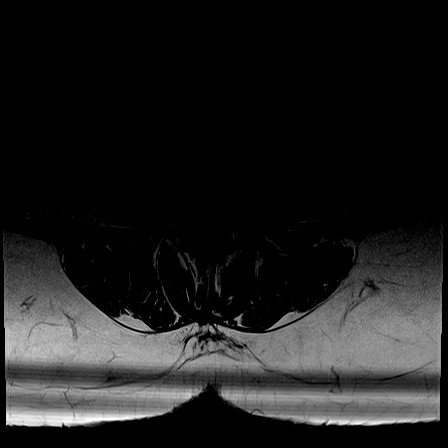
[im 17/32]
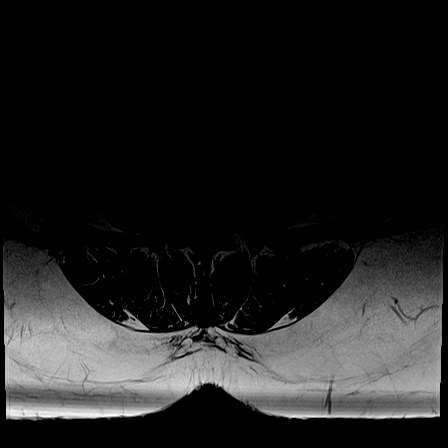
[im 29/32]
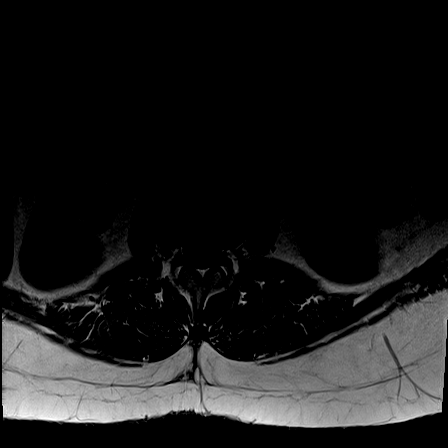

[27 of 48 positions shown; findings below may reference images not displayed]

FINDINGS: No aggressive marrow signal abnormality is seen. The conus medullaris ends at L1 and is normal in appearance. No pars defects are demonstrated. The prevertebral and paraspinal soft tissues are unremarkable.
L1-L2: No significant spinal stenosis or left neural foraminal narrowing. There is mild to moderate right neural foraminal narrowing from a foraminal extrusion.
L2-3: No significant spinal stenosis or neural foraminal narrowing.
L3-L4: Ligamentum flavum hypertrophy with mild stenosis but no neural foraminal narrowing
L4-5: Ligamentum flavum hypertrophy with moderate spinal stenosis. Progress protrusion with mild bilateral neural foraminal narrowing
L5-S1: Broad-based protrusion. Ligamentum flavum hypertrophy with mild spinal stenosis but no neural foraminal narrowing
IMPRESSION: 
IMPRESSION: Degenerative changes with spinal stenosis and neural foraminal narrowing as described above level by level
Is the patient pregnant?
No

## 2022-12-09 IMAGING — MG MAMMO BREAST SCREENING BILATERAL
8 of 15 series · 8 of 35 positions shown · non-contrast
Comparison: 05/02/2020 and 10/20/2021

This is a summary report. The complete report is available in the patient's medical record. If you cannot access the medical record, please contact the sending organization for a detailed fax or copy.
FINAL REPORT:
Bilateral digital screening mammogram with tomosynthesis
HISTORY: Routine mammogram exam.

[R MLO synth-2D (1 of 2)]
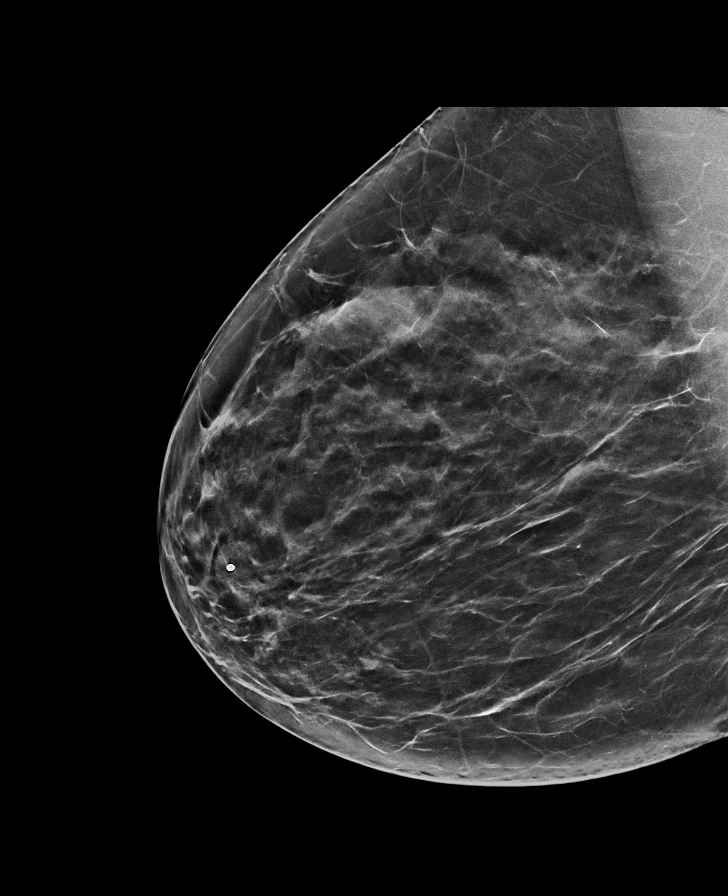

[L CC synth-2D]
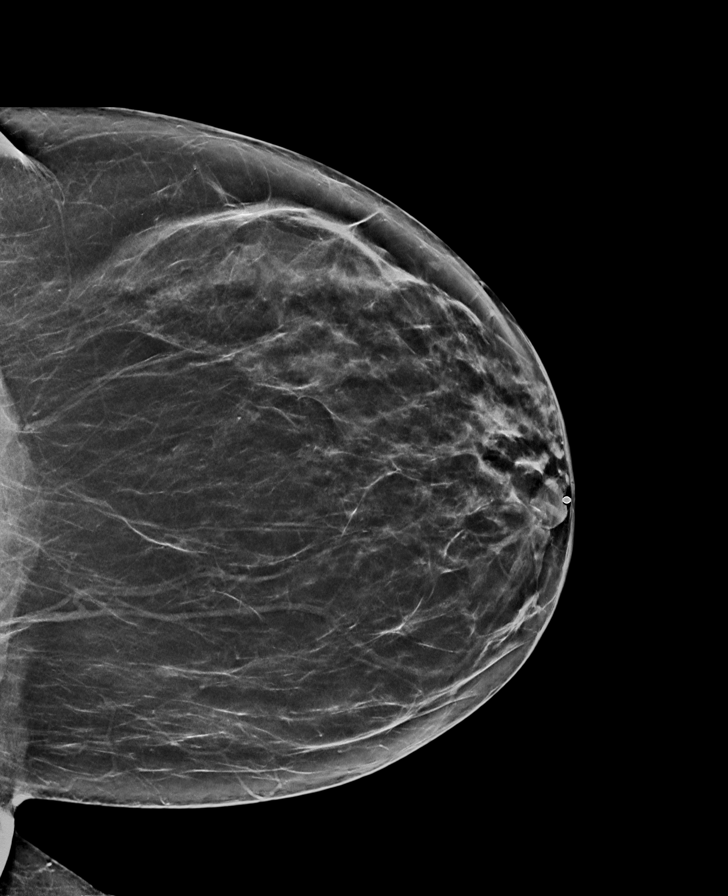

[R CC]
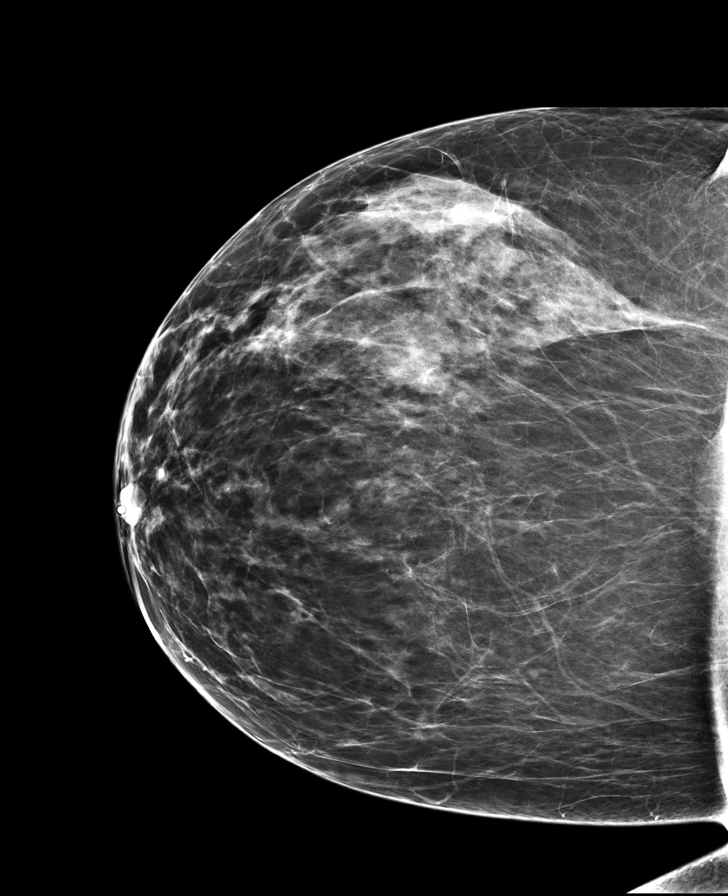

[R MLO (1 of 2)]
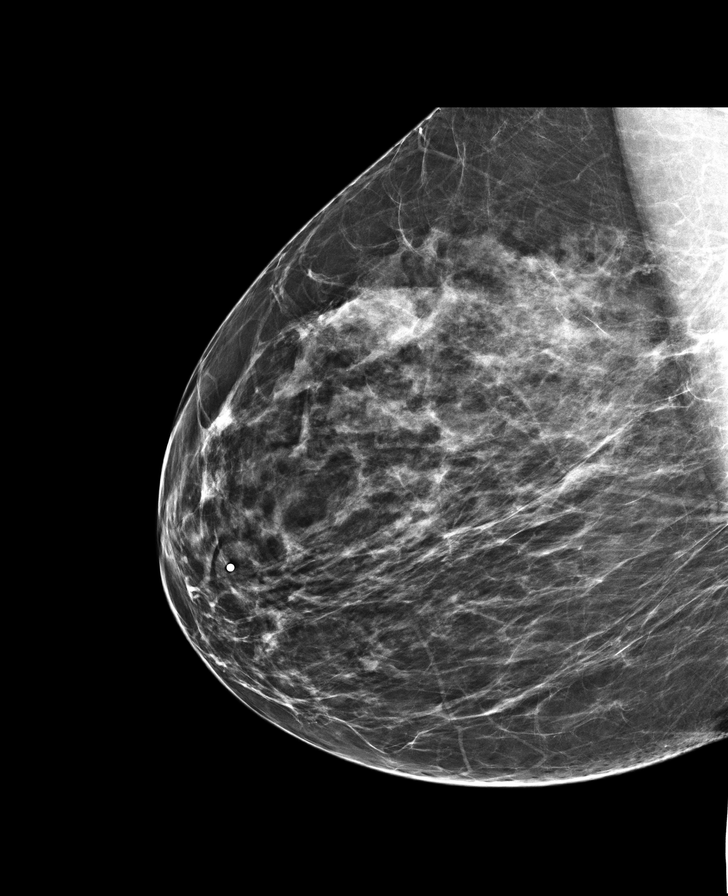

[R CC synth-2D]
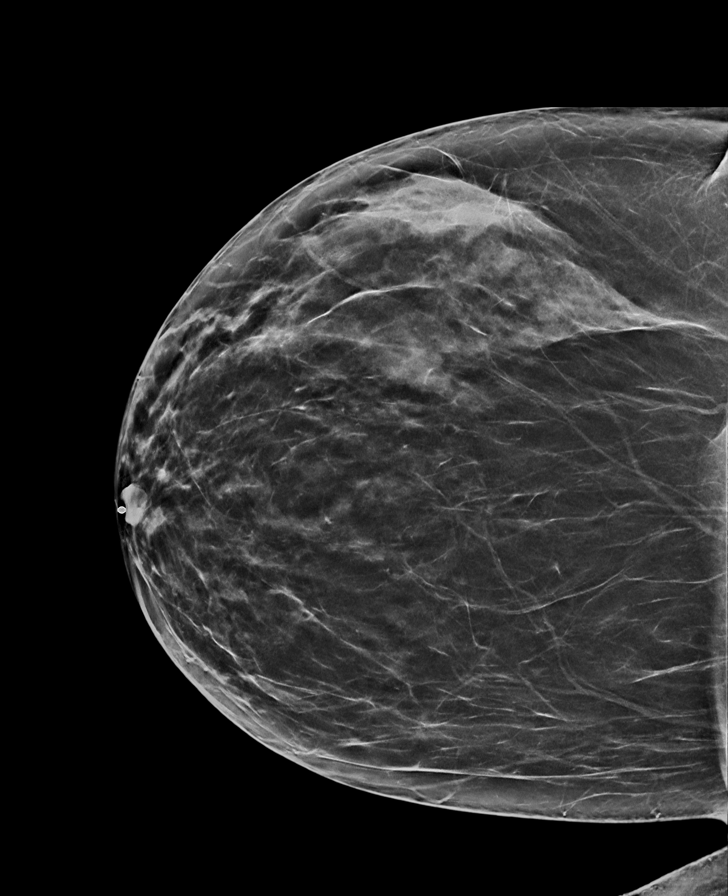

[R MLO (2 of 2)]
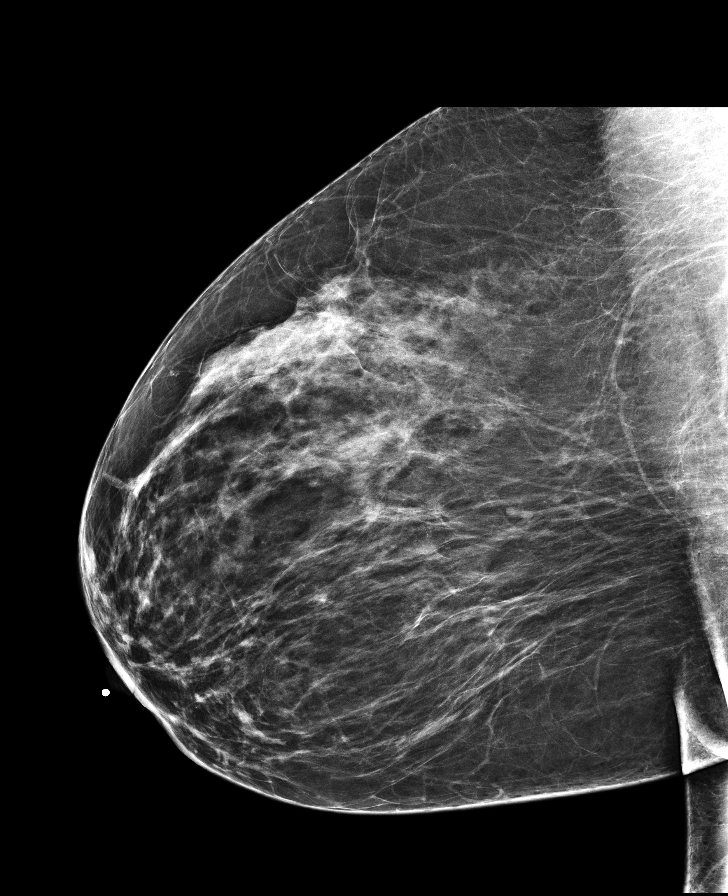

[L CC]
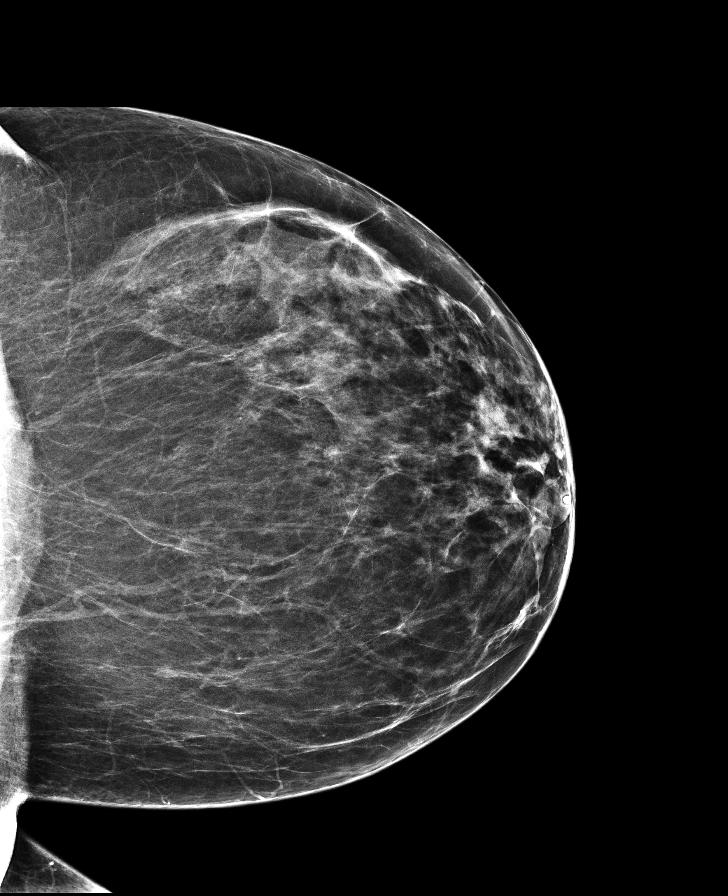

[R MLO synth-2D (2 of 2)]
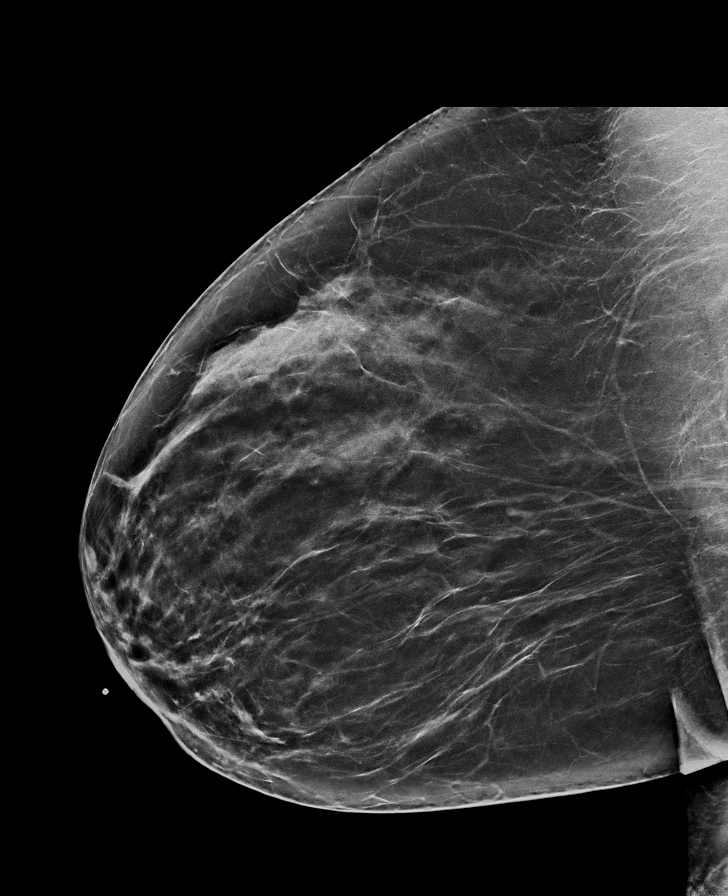

[8 of 35 positions shown; findings below may reference images not displayed]

FINDINGS: CAD was utilized for this examination. 3-D tomography performed in addition to routine imaging. The breasts are composed of scattered fibroglandular elements. No suspicious masses, architectural distortion, or calcifications.
IMPRESSION: 1. BI-RADS 1, negative mammogram.
2. Recommend routine annual screening mammogram.
ACR breast density: B, scattered fibroglandular densities
The patient will be entered into a reminder system with a target due date for the next mammogram.
Is the patient pregnant?
No

## 2023-06-23 IMAGING — CT CT HEAD WITHOUT CONTRAST
3 series · 16 of 47 positions shown, 19 images · non-contrast
Comparison: 01/07/2017

Patient reports sudden onset of left sided chest pain approx. 1 hour. Also reports dizziness and left sided numbness
FINAL REPORT:
CT HEAD WITHOUT CONTRAST
CLINICAL INDICATION: Transient ischemic attack (TIA)
TECHNIQUE: Axial images of the brain were obtained without contrast. CT exams at this facility are performed with dose modulation, iterative reconstruction, and/or weight based dosing when appropriate to reduce radiation exposure to As Low As Reasonably Achievable (MALOBA).

[Series 2: head stnd · axial · 0.49mm/px · z∈[-11,+134]mm · 10 of 36 slices shown, 13 images]
[im 3/36  brain]
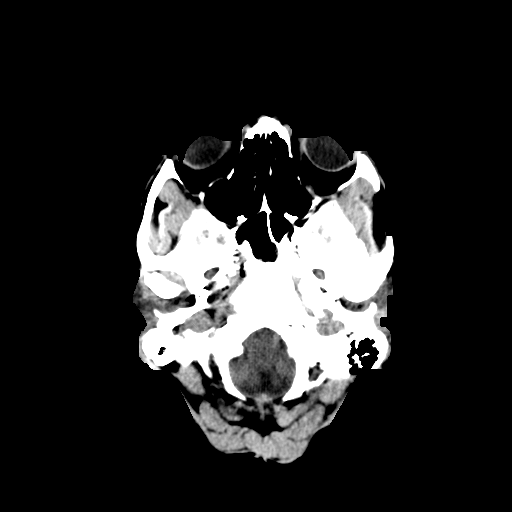
[im 3/36  bone]
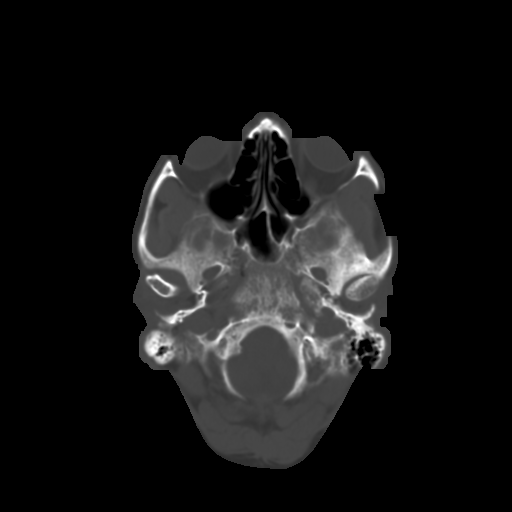
[im 7/36  brain]
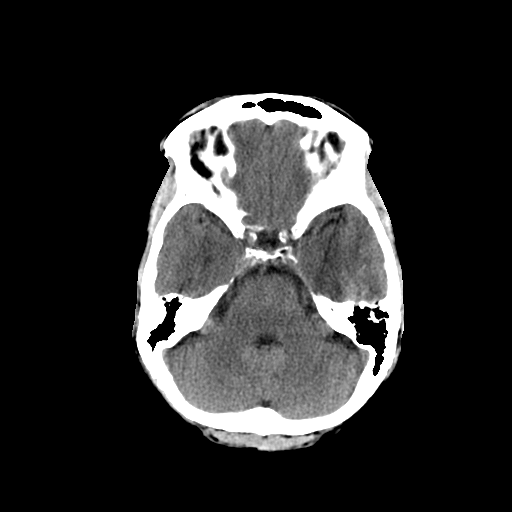
[im 9/36  brain]
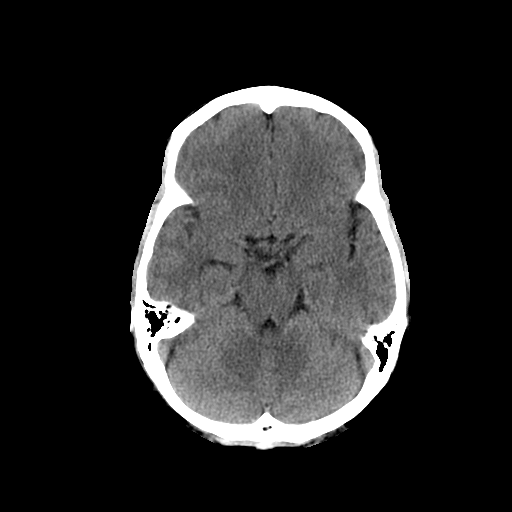
[im 13/36  brain]
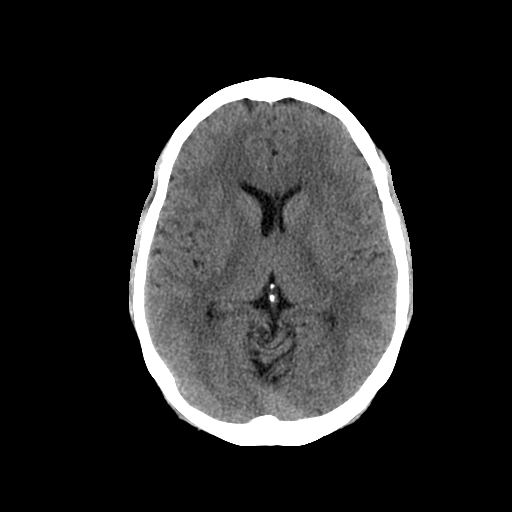
[im 16/36  brain]
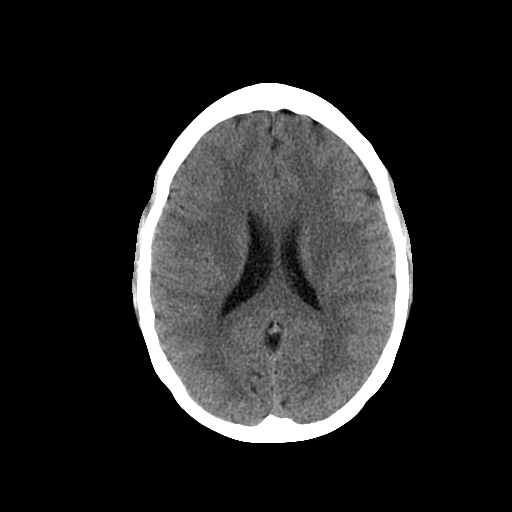
[im 16/36  bone]
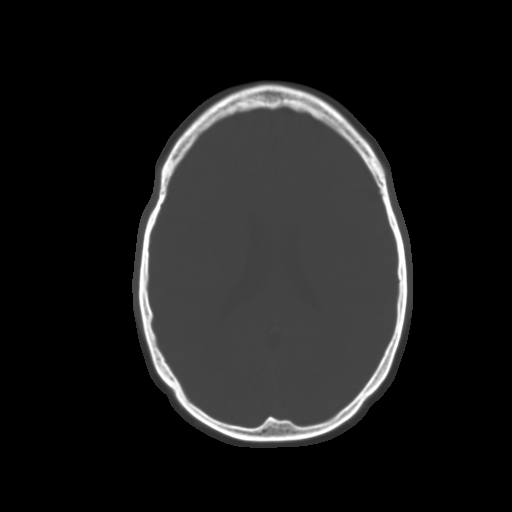
[im 19/36  brain]
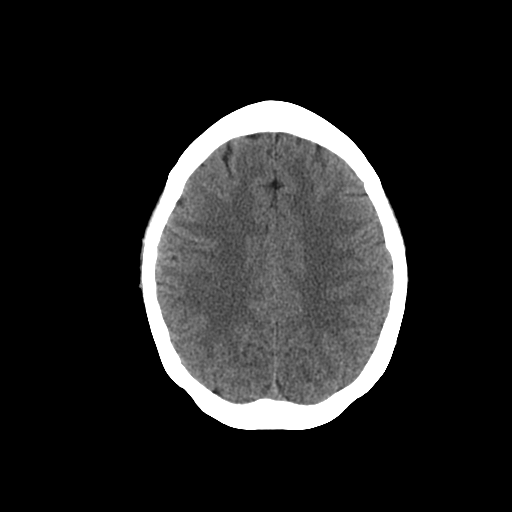
[im 22/36  brain]
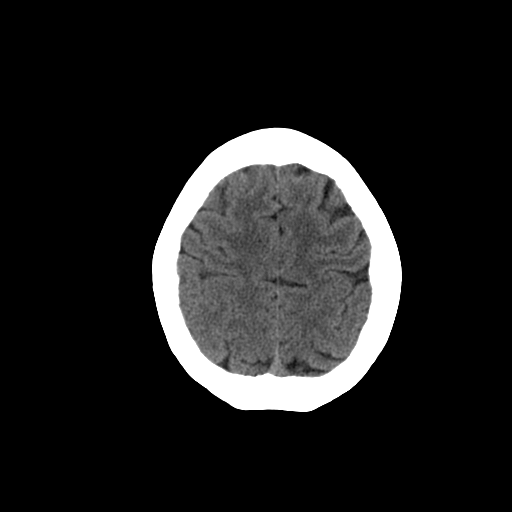
[im 26/36  brain]
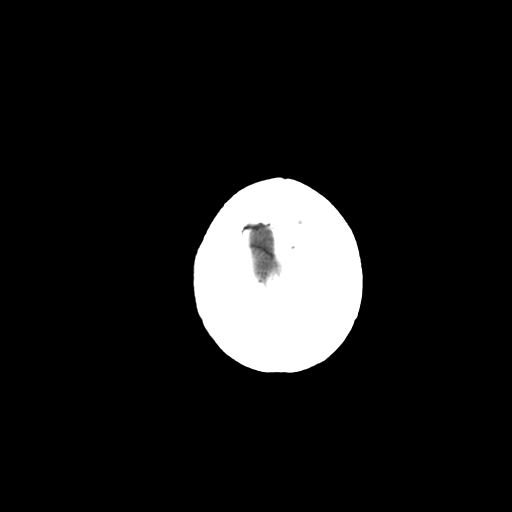
[im 28/36  brain]
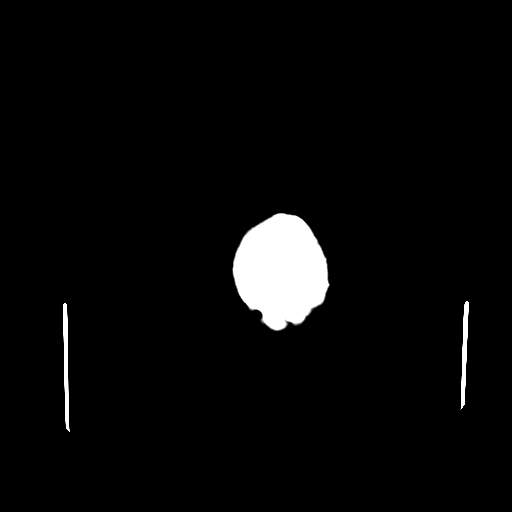
[im 28/36  bone]
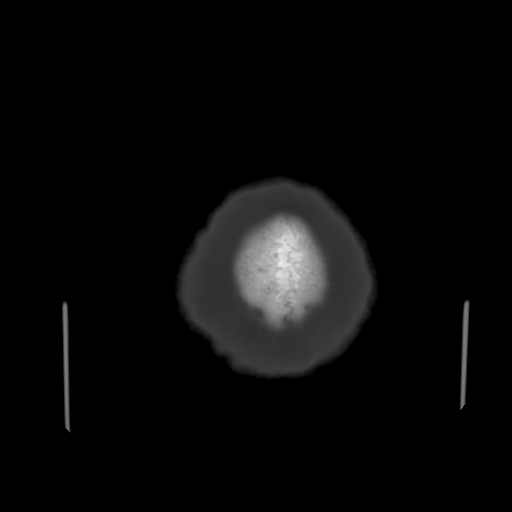
[im 32/36  brain]
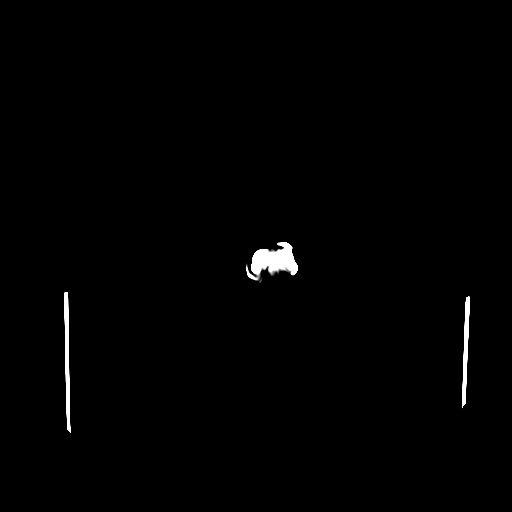

[Series 601: cor head · coronal · 0.49mm/px · 3 of 103 slices shown]
[im 35/103  brain]
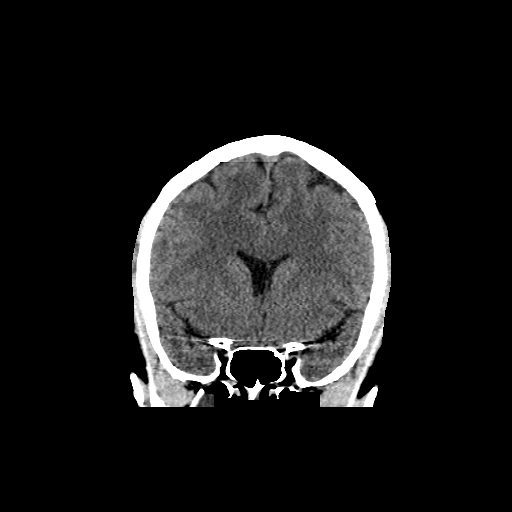
[im 46/103  brain]
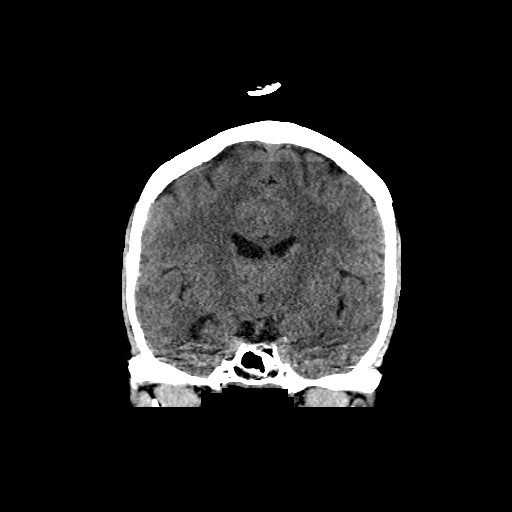
[im 57/103  brain]
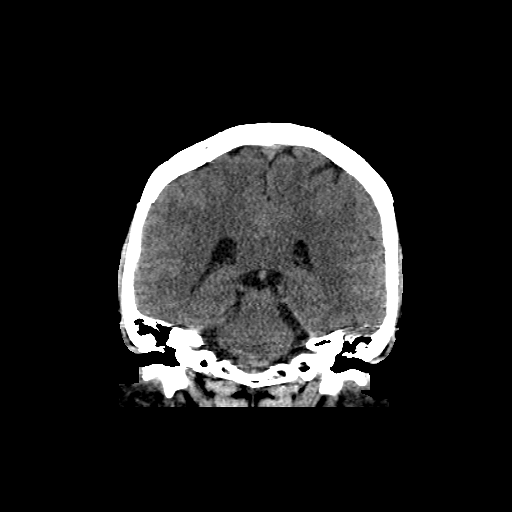

[Series 602: sag head · sagittal · 0.46mm/px · 3 of 52 slices shown]
[im 18/52  brain]
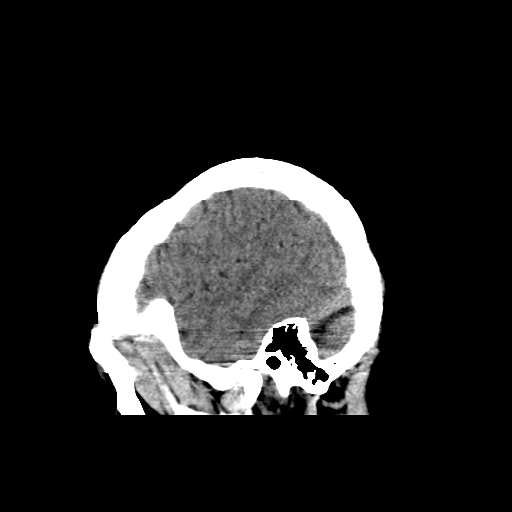
[im 26/52  brain]
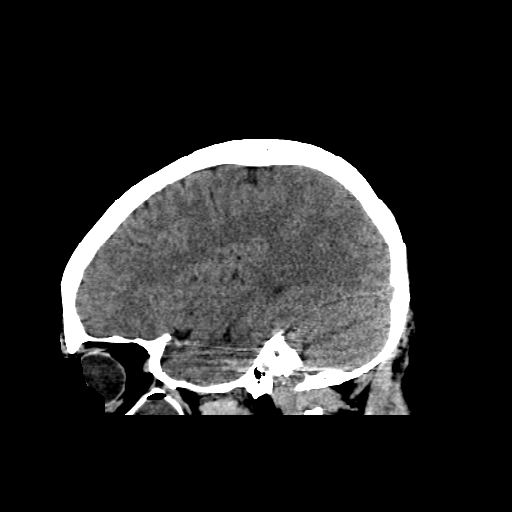
[im 35/52  brain]
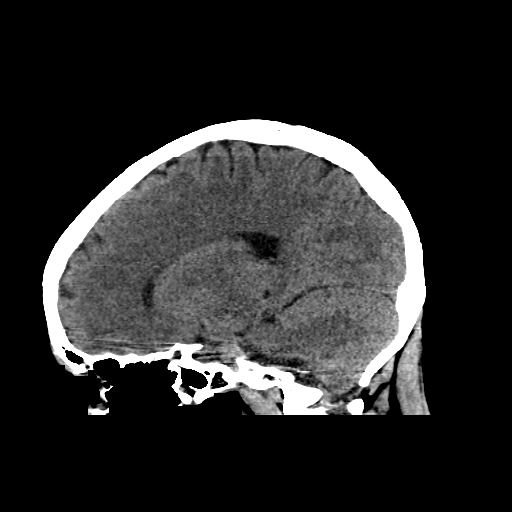

[16 of 47 positions shown; findings below may reference images not displayed]

FINDINGS: No evidence of acute large territory infarct. No acute intracranial hemorrhage. No hydrocephalus. No obvious mass or abnormal mass effect.
Right maxillary mucous retention cyst.
No acute or suspicious osseous abnormality.
IMPRESSION: No CT evidence of acute intracranial abnormality.
If symptoms persist, additional cross-sectional imaging such as MRI, or follow-up CT scan may be indicated. Pathology such as early ischemic events may not be visible on a noncontrast CT study.
Is the patient pregnant?
No

## 2023-06-23 IMAGING — MR MRI BRAIN WITHOUT CONTRAST
8 of 11 series · 32 of 48 positions shown · non-contrast
Comparison: CT head June 23, 2023

Lt leg and arm weakness, chest pain and dizziness.
FINAL REPORT:
EXAM: MRI of the brain without contrast.
HISTORY: Transient ischemic attack (TIA)
TECHNIQUE: Multisequence multiplanar images of the brain were obtained without contrast.

[Series 2001: survey_mpr_sag · sagittal · 1.6mm · 1.60mm/px · 1 of 5 slices shown]
[im 1/5]
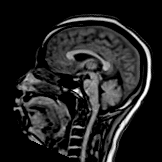

[Series 3001: survey_mpr_cor · coronal · 1.6mm · 1.60mm/px · 1 of 3 slices shown]
[im 1/3]
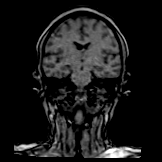

[Series 4001: survey_mpr_tra · axial · 1.6mm · 1.60mm/px · 1 of 3 slices shown]
[im 1/3]
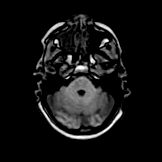

[Series 5001: T1 · sagittal · 5.0mm · 0.40mm/px · 5 of 27 slices shown (1 of 2)]
[im 1/27]
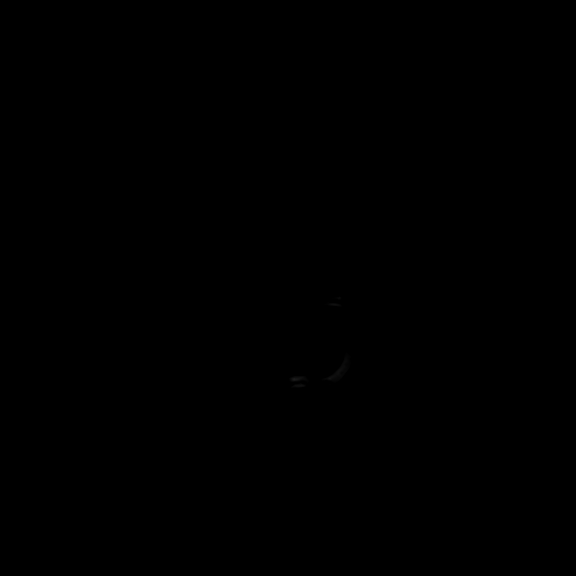
[im 7/27]
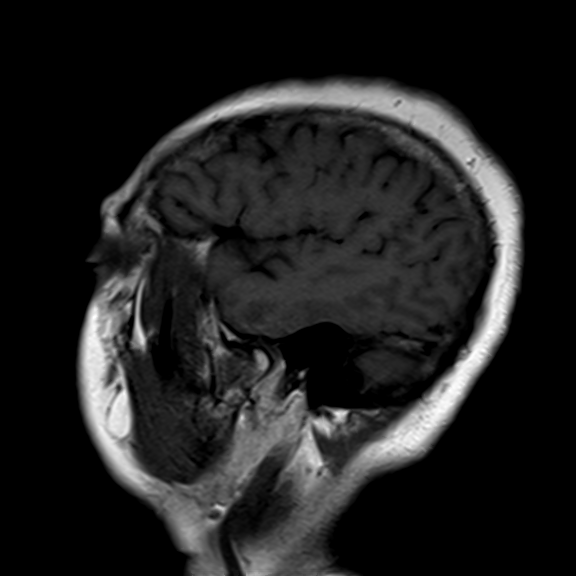
[im 14/27]
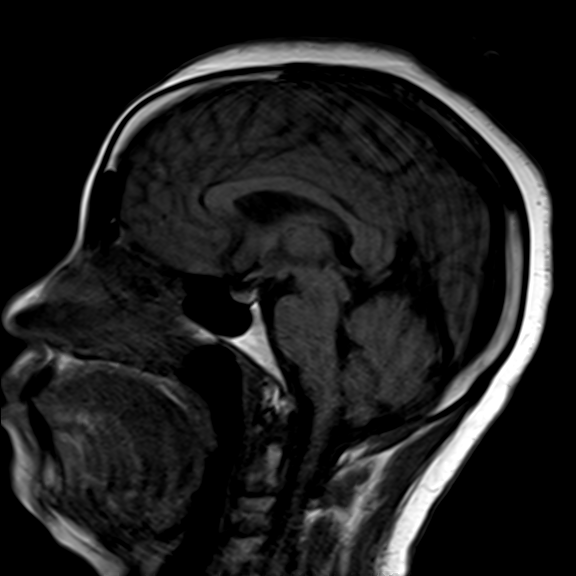
[im 20/27]
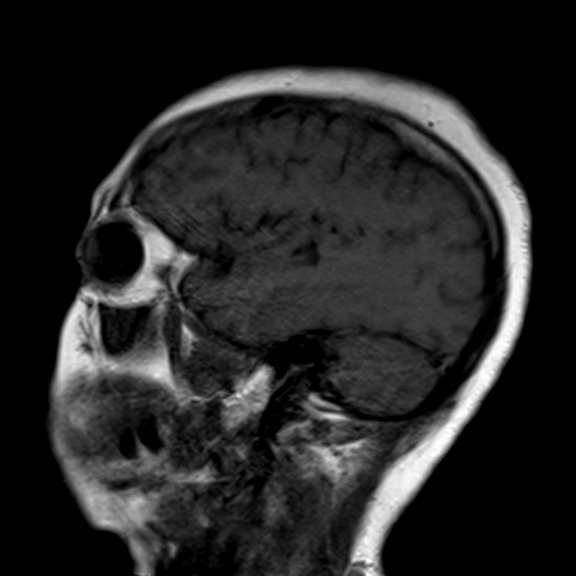
[im 27/27]
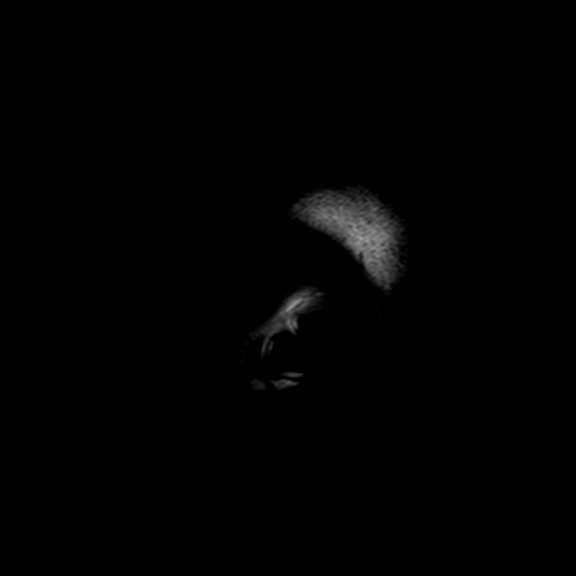

[Series 9001: T2 · axial · 5.0mm · 0.51mm/px · z∈[-76,+65]mm · 6 of 25 slices shown]
[im 1/25]
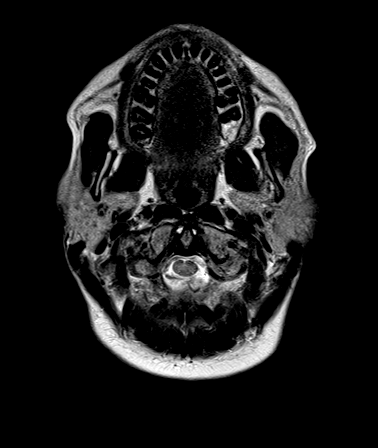
[im 5/25]
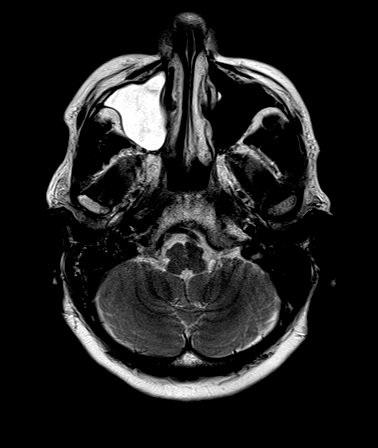
[im 10/25]
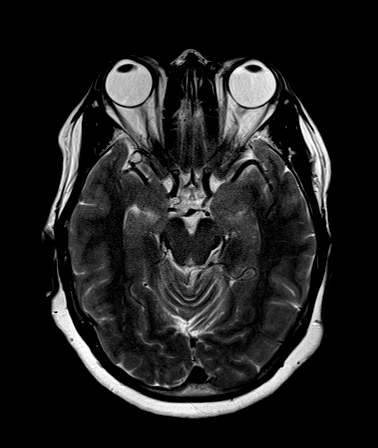
[im 15/25]
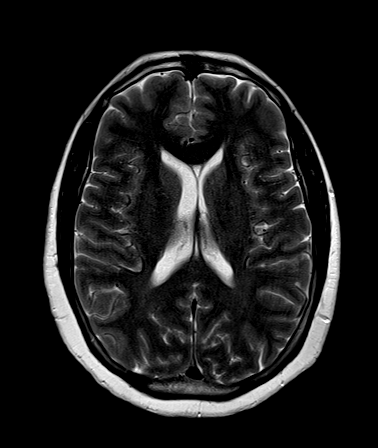
[im 20/25]
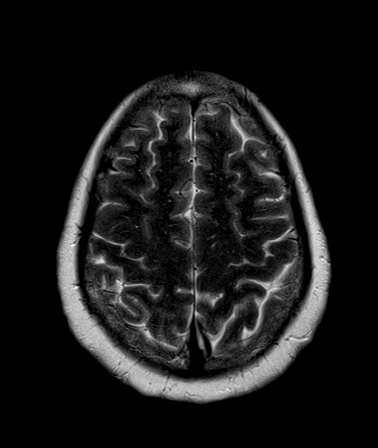
[im 25/25]
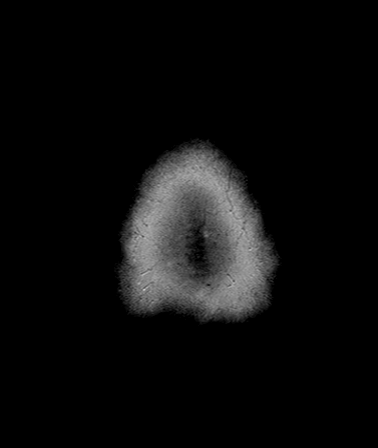

[T1 · axial · 5.0mm · 0.72mm/px · z∈[-75,+66]mm · 6 of 25 slices shown (2 of 2)]
[im 1/25]
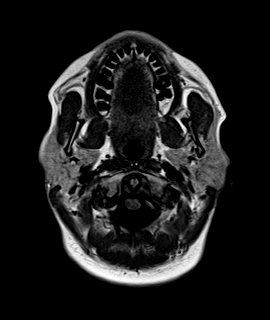
[im 5/25]
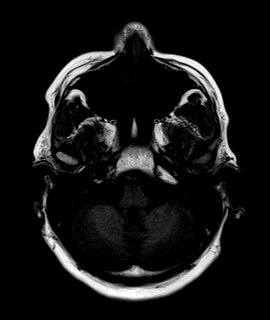
[im 10/25]
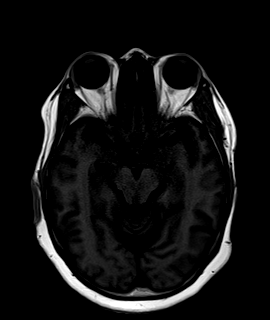
[im 15/25]
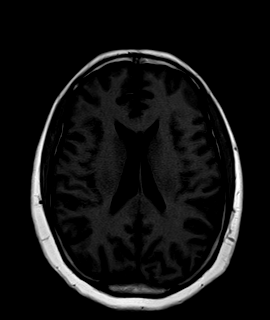
[im 20/25]
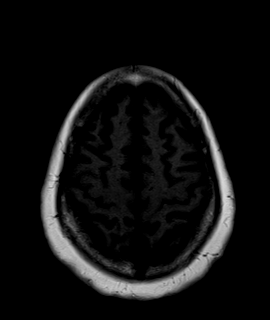
[im 25/25]
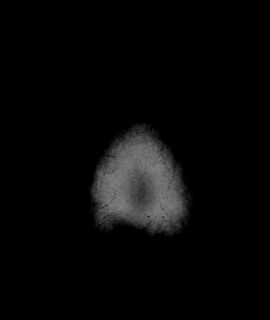

[FLAIR · axial · 5.0mm · 0.72mm/px · z∈[-75,+66]mm · 6 of 25 slices shown]
[im 1/25]
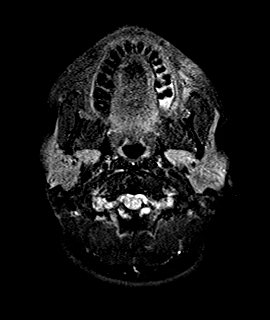
[im 5/25]
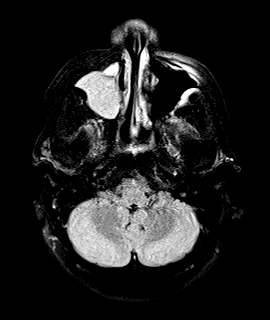
[im 10/25]
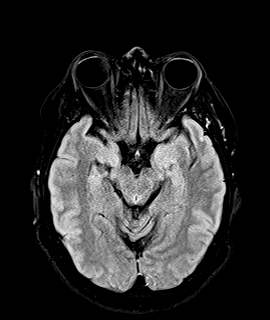
[im 15/25]
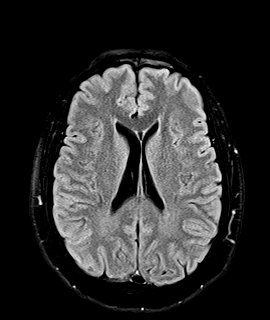
[im 20/25]
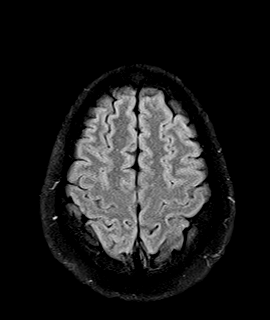
[im 25/25]
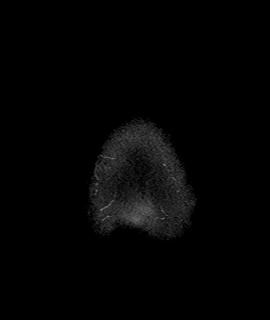

[GRE · axial · 5.0mm · 0.45mm/px · z∈[-75,+66]mm · 6 of 25 slices shown]
[im 1/25]
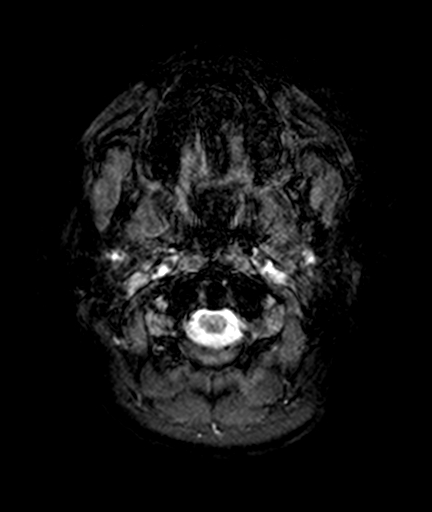
[im 5/25]
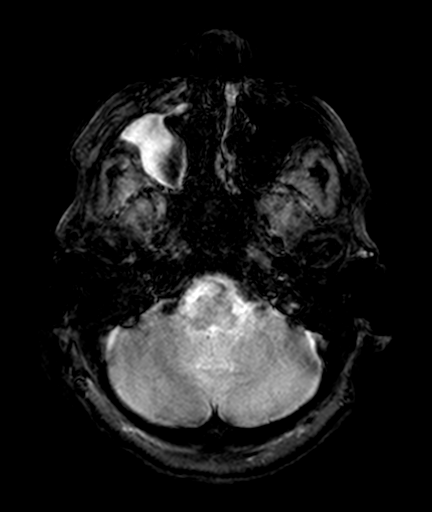
[im 10/25]
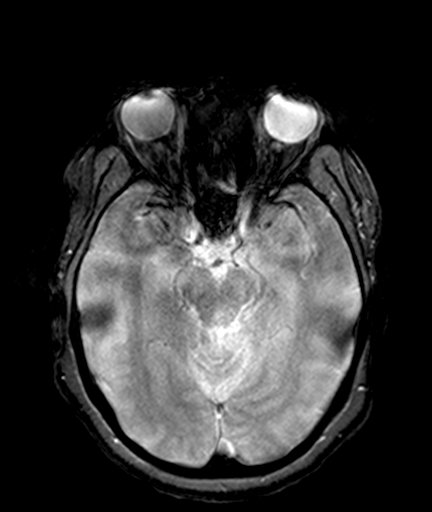
[im 15/25]
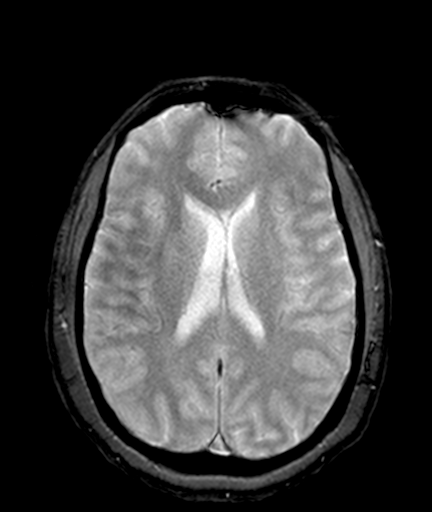
[im 20/25]
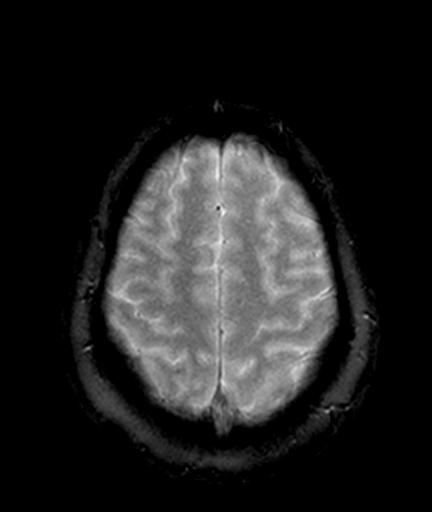
[im 25/25]
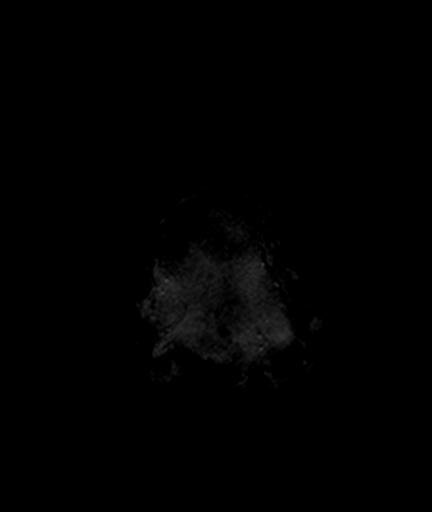

[32 of 48 positions shown; findings below may reference images not displayed]

FINDINGS: Motion-degraded study
No evidence for acute infarct, intracranial hemorrhage or mass lesion.  No extra-axial fluid collections.
No midline shift. Basal cisterns are patent.  Ventricles are normal and symmetric.
Intracranial vascular flow voids are normal.
Orbital contents are unremarkable.
Near complete opacification of the right maxillary sinus and partial opacification of the left maxillary sinus. Partial opacification of the bilateral ethmoid air cells.
IMPRESSION: 1.  No acute intracranial abnormality.
2.  Paranasal sinus disease, nonspecific but can be seen in sinusitis.
Is the patient pregnant?
No

## 2023-06-23 IMAGING — DX XR CHEST 1 VIEW
1 series · 1 of 1 positions shown · non-contrast
Comparison: 08/24/2020

FINAL REPORT:
XR CHEST 1 VIEW
CLINICAL INDICATION: chest pain

[AP]
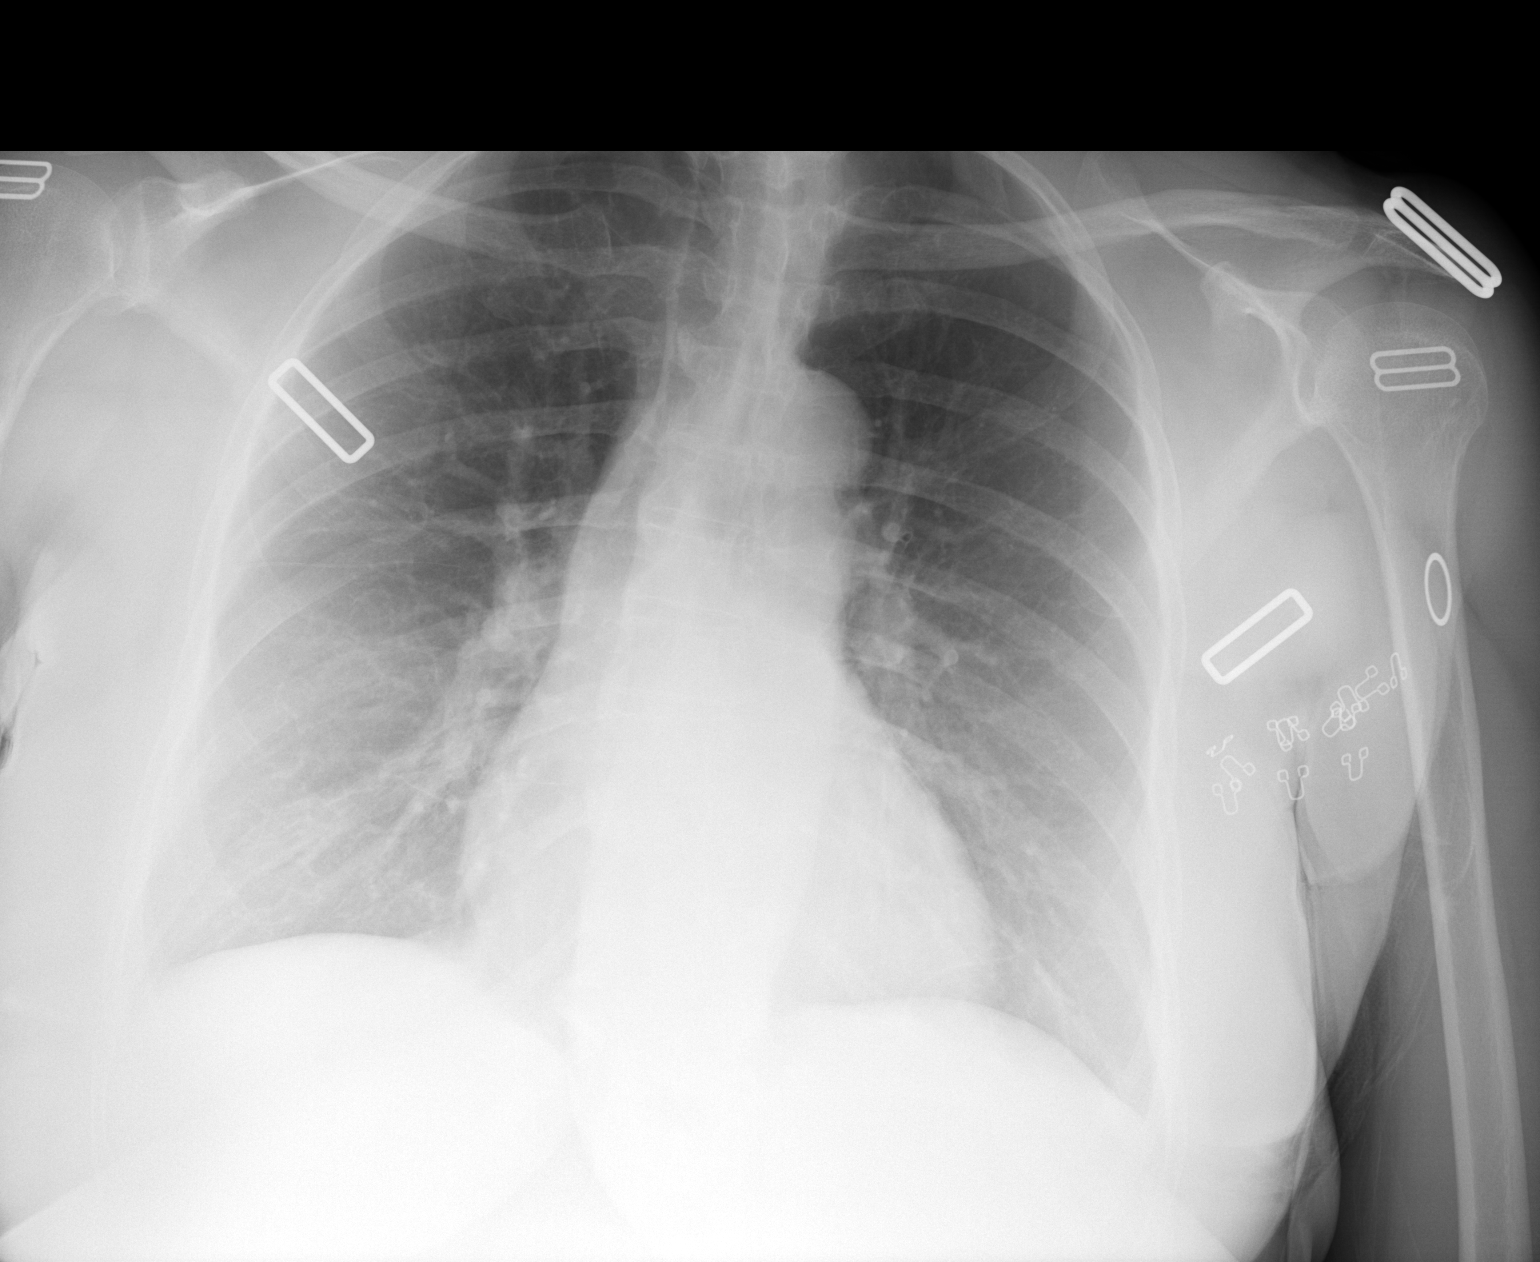

[1 of 1 positions shown; findings below may reference images not displayed]

FINDINGS: The cardiomediastinal silhouette is within normal limits for size.
No focal consolidation, pleural effusion or appreciable pneumothorax.
No acute osseous abnormality.
IMPRESSION: No acute finding.
Is the patient pregnant?
No

## 2024-11-02 IMAGING — MG MAMMO BREAST DIAGNOSTIC TOMOSYNTHESIS BILATERAL
8 of 15 series · 8 of 35 positions shown · non-contrast
Comparison: 12/09/2022
Breast density: Heterogeneously dense which can limit the sensitivity of mammography (C)
COMPARISON: 12/09/2022
Breast density: Heterogeneously dense which can limit the sensitivity of mammography (C)

This is a summary report. The complete report is available in the patient's medical record. If you cannot access the medical record, please contact the sending organization for a detailed fax or copy.
FINAL REPORT:
MAMMO BREAST DIAGNOSTIC TOMOSYNTHESIS 3D BILATERAL
INDICATION: 58-year-old female with pain under the right breast x3 weeks and left nipple pain.
TECHNIQUE: Bilateral digital CC and MLO images of the breasts were obtained with tomosynthesis. CAD was utilized.

[R MLO]
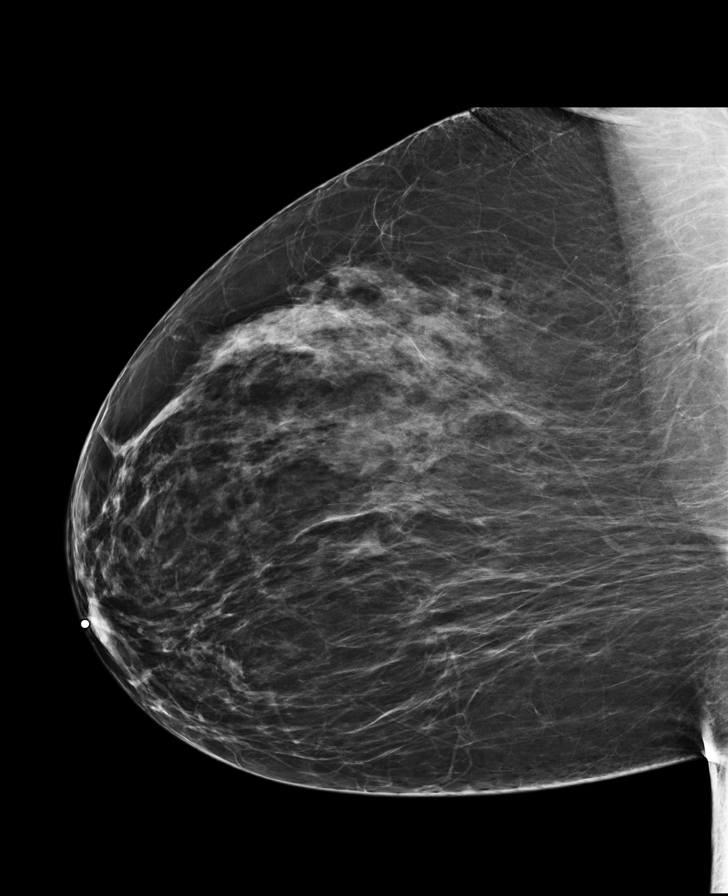

[R ML]
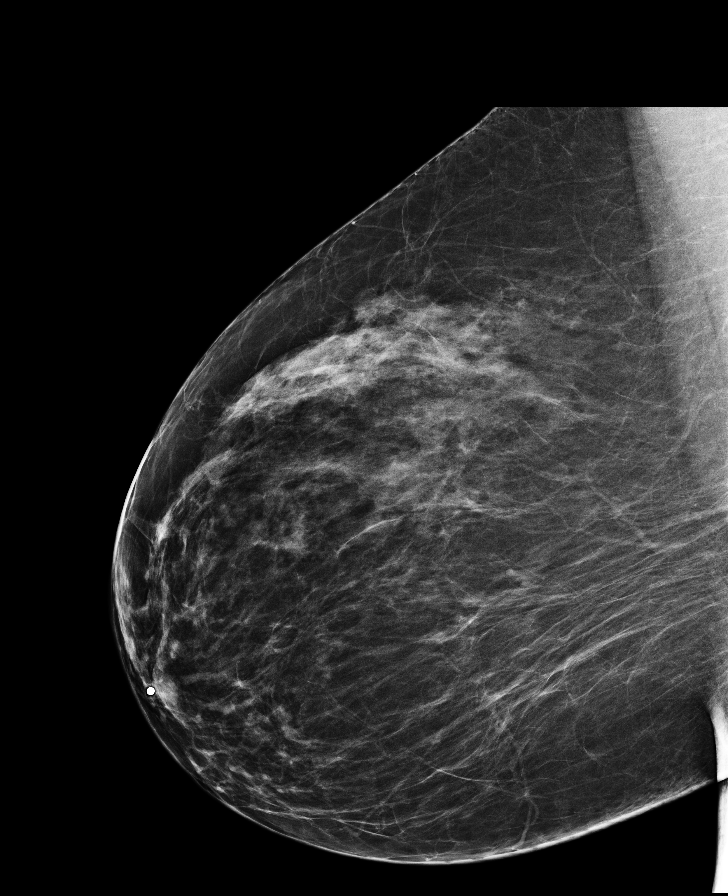

[R CC synth-2D]
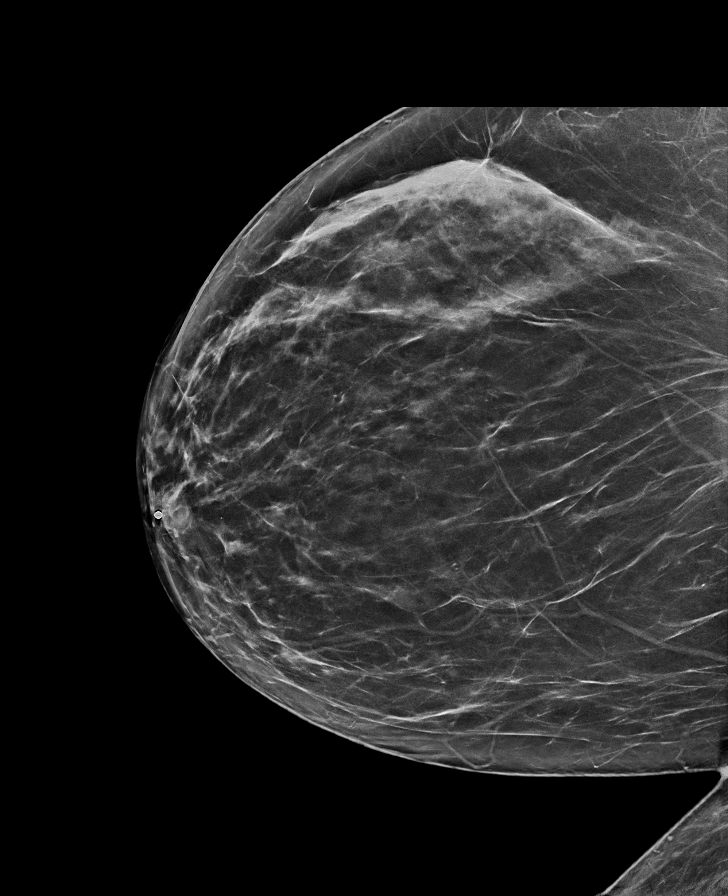

[L CC synth-2D]
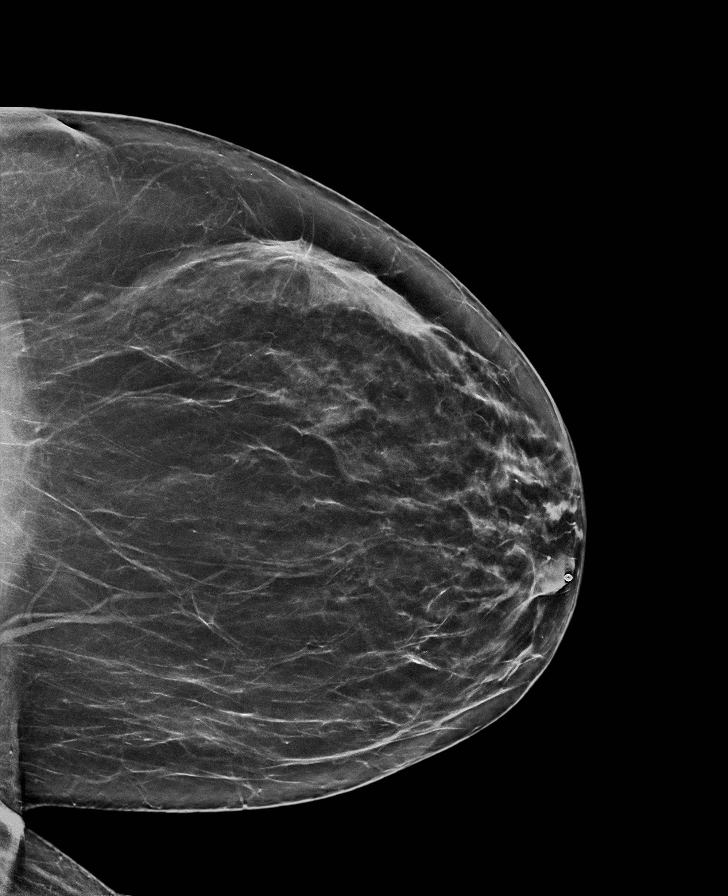

[R CC]
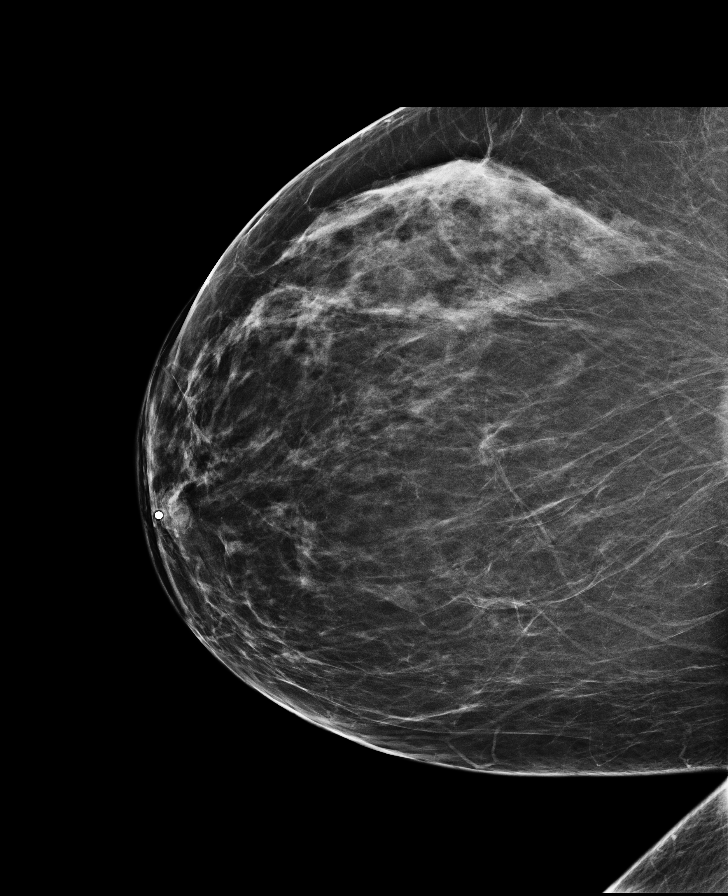

[L MLO synth-2D]
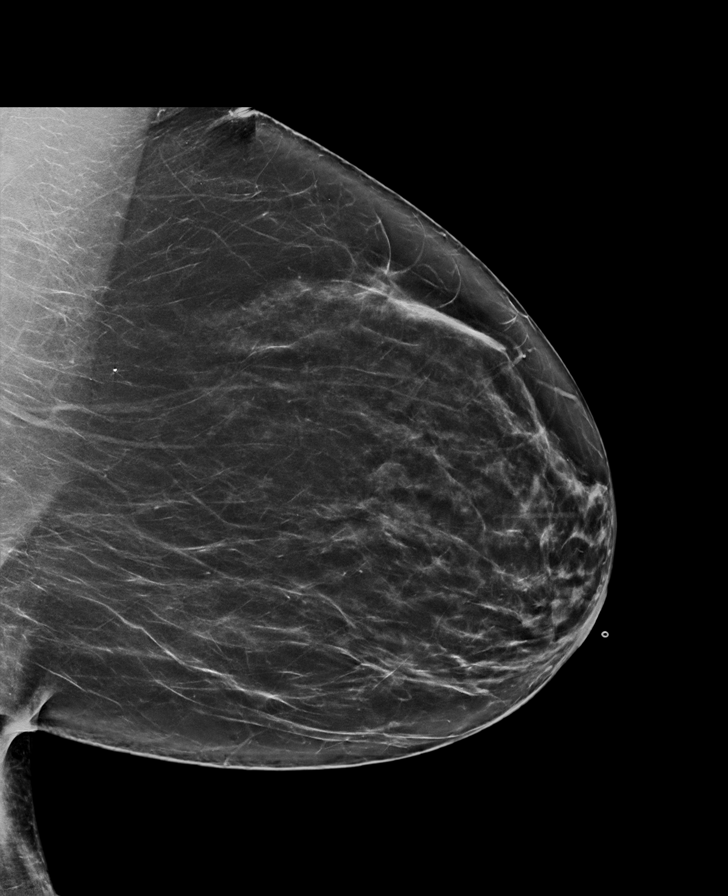

[L MLO]
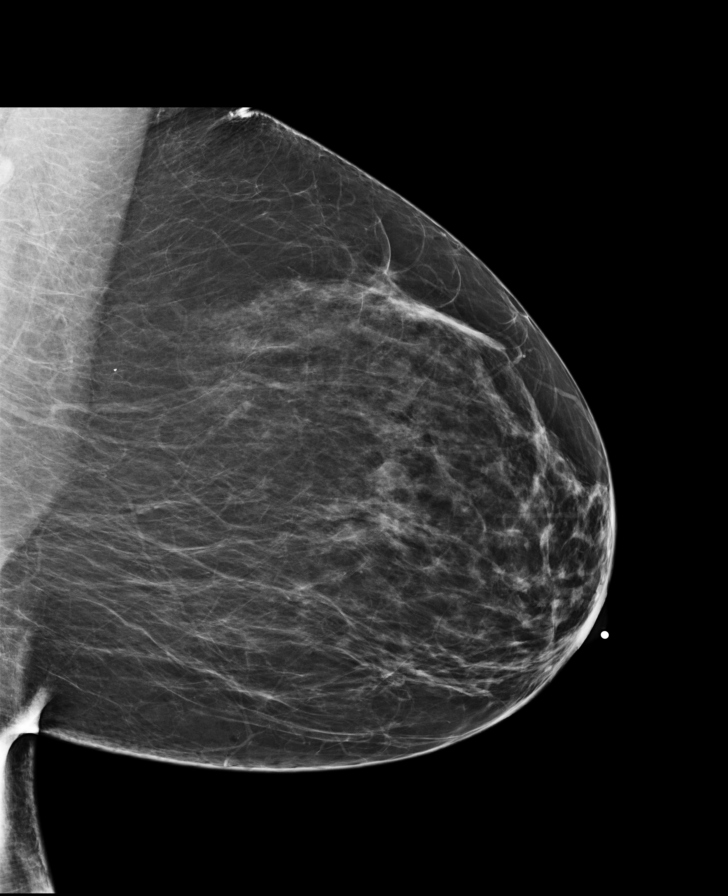

[R MLO synth-2D]
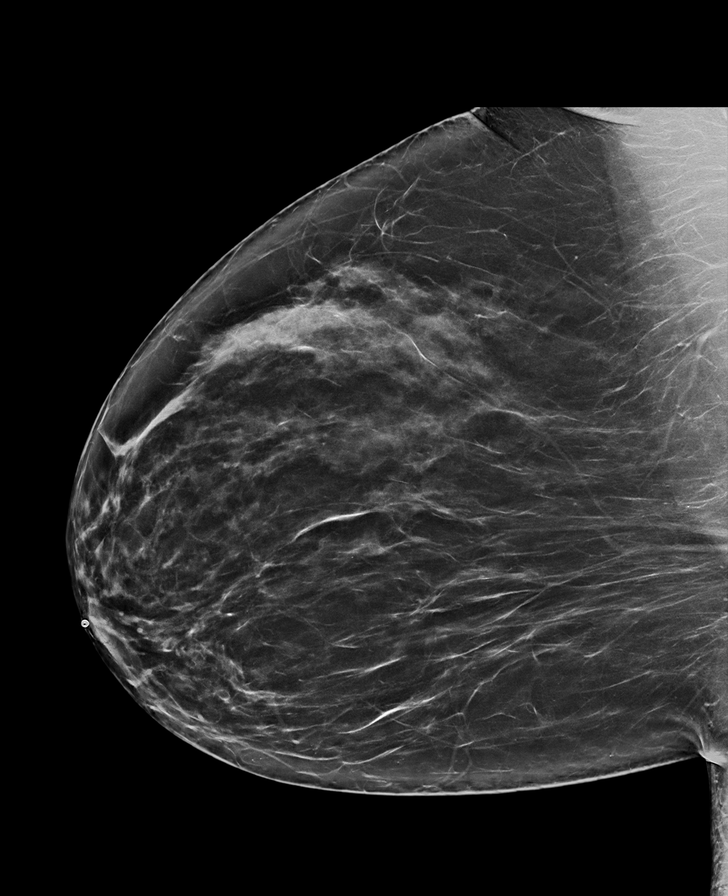

[8 of 35 positions shown; findings below may reference images not displayed]

FINDINGS: There is a benign calcification in the left breast. No suspicious masses or calcifications. No skin thickening or nipple retraction.
IMPRESSION: 
IMPRESSION: No mammographic evidence of malignancy.
Recommendations: Annual screening.
BI-RADS code: 2; benign findings
**One must recognize that there is as high as a 10-15% false-negative rate on a single screening mammogram. Current recommendations are for annual screening mammography from age 40 onwards.**
The patient will be entered into a reminder system with a target due date for the next mammogram.
Is the patient pregnant?
No

FINAL REPORT:
MAMMO BREAST DIAGNOSTIC TOMOSYNTHESIS 3D BILATERAL
FINDINGS: There is a benign calcification in the left breast. No suspicious masses or calcifications. No skin thickening or nipple retraction.
IMPRESSION: 
IMPRESSION: No mammographic evidence of malignancy.
Recommendations: Annual screening.
BI-RADS code: 2; benign findings
**One must recognize that there is as high as a 10-15% false-negative rate on a single screening mammogram. Current recommendations are for annual screening mammography from age 40 onwards.**
The patient will be entered into a reminder system with a target due date for the next mammogram.
Is the patient pregnant?
No
